# Patient Record
Sex: Female | Born: 1935 | Race: Black or African American | Hispanic: No | Smoking: Never smoker
Health system: Southern US, Community
[De-identification: ages and names within clinical notes are randomized; demographics above are authoritative.]

## PROBLEM LIST (undated history)

## (undated) DIAGNOSIS — F0280 Dementia in other diseases classified elsewhere without behavioral disturbance: Secondary | ICD-10-CM

## (undated) DIAGNOSIS — E079 Disorder of thyroid, unspecified: Secondary | ICD-10-CM

## (undated) DIAGNOSIS — R413 Other amnesia: Secondary | ICD-10-CM

## (undated) DIAGNOSIS — G309 Alzheimer's disease, unspecified: Secondary | ICD-10-CM

## (undated) HISTORY — DX: Alzheimer's disease, unspecified: G30.9

## (undated) HISTORY — PX: MOUTH SURGERY: SHX715

## (undated) HISTORY — PX: THYROID SURGERY: SHX805

## (undated) HISTORY — PX: ABDOMINAL HYSTERECTOMY: SHX81

## (undated) HISTORY — DX: Disorder of thyroid, unspecified: E07.9

## (undated) HISTORY — DX: Dementia in other diseases classified elsewhere, unspecified severity, without behavioral disturbance, psychotic disturbance, mood disturbance, and anxiety: F02.80

---

## 1999-09-29 ENCOUNTER — Ambulatory Visit (HOSPITAL_COMMUNITY): Admission: RE | Admit: 1999-09-29 | Discharge: 1999-09-29 | Payer: Self-pay | Admitting: *Deleted

## 2002-06-17 ENCOUNTER — Encounter: Admission: RE | Admit: 2002-06-17 | Discharge: 2002-06-17 | Payer: Self-pay | Admitting: Family Medicine

## 2002-06-17 ENCOUNTER — Encounter: Payer: Self-pay | Admitting: Family Medicine

## 2002-06-25 ENCOUNTER — Ambulatory Visit (HOSPITAL_COMMUNITY): Admission: RE | Admit: 2002-06-25 | Discharge: 2002-06-25 | Payer: Self-pay | Admitting: Family Medicine

## 2002-06-25 ENCOUNTER — Encounter: Payer: Self-pay | Admitting: Family Medicine

## 2002-08-26 ENCOUNTER — Encounter: Payer: Self-pay | Admitting: Surgery

## 2002-08-27 ENCOUNTER — Ambulatory Visit (HOSPITAL_COMMUNITY): Admission: RE | Admit: 2002-08-27 | Discharge: 2002-08-28 | Payer: Self-pay | Admitting: Surgery

## 2011-10-11 ENCOUNTER — Other Ambulatory Visit (HOSPITAL_COMMUNITY): Payer: Self-pay | Admitting: Neurology

## 2011-10-11 DIAGNOSIS — R413 Other amnesia: Secondary | ICD-10-CM

## 2011-10-19 ENCOUNTER — Inpatient Hospital Stay (HOSPITAL_COMMUNITY): Admission: RE | Admit: 2011-10-19 | Payer: Self-pay | Source: Ambulatory Visit

## 2011-11-01 ENCOUNTER — Encounter (HOSPITAL_COMMUNITY): Payer: Self-pay

## 2011-11-01 ENCOUNTER — Encounter (HOSPITAL_COMMUNITY)
Admission: RE | Admit: 2011-11-01 | Discharge: 2011-11-01 | Disposition: A | Discharge status: Home / Self Care | Payer: Self-pay | Source: Ambulatory Visit | Attending: Neurology | Admitting: Neurology

## 2011-11-01 DIAGNOSIS — R413 Other amnesia: Secondary | ICD-10-CM | POA: Insufficient documentation

## 2011-11-01 DIAGNOSIS — Z006 Encounter for examination for normal comparison and control in clinical research program: Secondary | ICD-10-CM | POA: Insufficient documentation

## 2011-11-01 HISTORY — DX: Other amnesia: R41.3

## 2012-01-25 ENCOUNTER — Ambulatory Visit (INDEPENDENT_AMBULATORY_CARE_PROVIDER_SITE_OTHER): Payer: Medicare PPO | Admitting: Emergency Medicine

## 2012-01-25 VITALS — BP 141/72 | HR 78 | Temp 98.5°F | Resp 16 | Ht 63.5 in | Wt 104.0 lb

## 2012-01-25 DIAGNOSIS — M79646 Pain in unspecified finger(s): Secondary | ICD-10-CM

## 2012-01-25 DIAGNOSIS — S61219A Laceration without foreign body of unspecified finger without damage to nail, initial encounter: Secondary | ICD-10-CM

## 2012-01-25 DIAGNOSIS — S61209A Unspecified open wound of unspecified finger without damage to nail, initial encounter: Secondary | ICD-10-CM

## 2012-01-25 DIAGNOSIS — M79609 Pain in unspecified limb: Secondary | ICD-10-CM

## 2012-01-25 NOTE — Progress Notes (Signed)
   Date:  01/25/2012   Name:  Sheina Mcleish   DOB:  November 19, 1928   MRN:  [id number] Gender: female  Age: 78 y.o.  PCP:  No primary provider on file.    Chief Complaint: Hand Pain   History of Present Illness:  Amarah Brossman is a 78 y.o. pleasant patient who presents with the following:  Injured left second finger with knife while cutting up chicken today.  Denies other complaint.  Not up to date on tetanus.  No numbness or paresthesias.    There is no problem list on file for this patient.   Past Medical History  Diagnosis Date  . Memory loss     No past surgical history on file.  History  Substance Use Topics  . Smoking status: Never Smoker   . Smokeless tobacco: Not on file  . Alcohol Use: Not on file    No family history on file.  No Known Allergies  Medication list has been reviewed and updated.  Current Outpatient Prescriptions on File Prior to Visit  Medication Sig Dispense Refill  . donepezil (ARICEPT) 5 MG tablet Take 5 mg by mouth at bedtime.      . hydrochlorothiazide (HYDRODIURIL) 25 MG tablet Take 25 mg by mouth daily.      Marland Kitchen levothyroxine (SYNTHROID, LEVOTHROID) 75 MCG tablet Take 75 mcg by mouth daily.      . potassium chloride (KLOR-CON) 20 MEQ packet Take 20 mEq by mouth daily.        Review of Systems:  As per HPI, otherwise negative.    Physical Examination: Filed Vitals:   01/25/12 1311  BP: 141/72  Pulse: 78  Temp: 98.5 F (36.9 C)  Resp: 16   Filed Vitals:   01/25/12 1311  Height: 5' 3.5" (1.613 m)  Weight: 104 lb (47.174 kg)   Body mass index is 18.13 kg/(m^2). Ideal Body Weight: Weight in (lb) to have BMI = 25: 143.1    GEN: WDWN, NAD, Non-toxic, Alert & Oriented x 3 HEENT: Atraumatic, Normocephalic.  Ears and Nose: No external deformity. EXTR: No clubbing/cyanosis/edema NEURO: Normal gait.  PSYCH: Normally interactive. Conversant. Not depressed or anxious appearing.  Calm demeanor.  Transverse laceration finger at  PIP.  Obvious extensor tendon injury with inability to extend finger and tendon ends visible in wound.    Assessment and Plan:  Extensor tendon laceration Refer hand surgery  Carmelina Dane, M.D.

## 2012-01-25 NOTE — Progress Notes (Signed)
Patient ID: Artelia Game MRN: [id number], DOB: May 07, 1929, 78 y.o. Date of Encounter: 01/25/2012, 2:15 PM   PROCEDURE NOTE: Verbal consent obtained. Sterile technique employed. Numbing: Anesthesia obtained with 2% lidocaine plain.  Cleansed with soap and water. Irrigated.  Wound explored no foreign bodies.  Extensor tendon laceration.  Wound repaired with # 7 SI sutures using 5-0 ethilon. Hemostasis obtained. Wound cleansed and dressed.  Wound care instructions including precautions covered with patient. Handout given.  Patient to follow up with hand specialist.  Rhoderick Moody, PA-C 01/25/2012 2:15 PM

## 2012-03-05 ENCOUNTER — Other Ambulatory Visit: Payer: Self-pay | Admitting: Family Medicine

## 2012-03-05 ENCOUNTER — Ambulatory Visit
Admission: RE | Admit: 2012-03-05 | Discharge: 2012-03-05 | Disposition: A | Discharge status: Home / Self Care | Payer: No Typology Code available for payment source | Source: Ambulatory Visit | Attending: Family Medicine | Admitting: Family Medicine

## 2012-03-05 DIAGNOSIS — T1490XA Injury, unspecified, initial encounter: Secondary | ICD-10-CM

## 2012-10-16 ENCOUNTER — Ambulatory Visit (INDEPENDENT_AMBULATORY_CARE_PROVIDER_SITE_OTHER): Payer: Medicaid Other | Admitting: *Deleted

## 2012-10-16 DIAGNOSIS — I639 Cerebral infarction, unspecified: Secondary | ICD-10-CM

## 2012-10-16 DIAGNOSIS — Z0289 Encounter for other administrative examinations: Secondary | ICD-10-CM

## 2012-10-17 NOTE — Progress Notes (Signed)
This participant in the office today for her Avid Exit Visit. Patient scales and Adas-Cog was completed by Lorenza Burton and [facility].  Dr. Pearlean Brownie did the Neurological Exam.

## 2013-04-29 ENCOUNTER — Other Ambulatory Visit: Payer: Self-pay | Admitting: Family Medicine

## 2013-04-29 DIAGNOSIS — D179 Benign lipomatous neoplasm, unspecified: Secondary | ICD-10-CM

## 2013-05-04 ENCOUNTER — Ambulatory Visit
Admission: RE | Admit: 2013-05-04 | Discharge: 2013-05-04 | Disposition: A | Discharge status: Home / Self Care | Payer: No Typology Code available for payment source | Source: Ambulatory Visit | Attending: Family Medicine | Admitting: Family Medicine

## 2013-05-04 DIAGNOSIS — D179 Benign lipomatous neoplasm, unspecified: Secondary | ICD-10-CM

## 2013-05-12 ENCOUNTER — Encounter: Payer: Self-pay | Admitting: Family Medicine

## 2013-05-12 DIAGNOSIS — G259 Extrapyramidal and movement disorder, unspecified: Secondary | ICD-10-CM | POA: Insufficient documentation

## 2013-05-12 DIAGNOSIS — F0391 Unspecified dementia with behavioral disturbance: Secondary | ICD-10-CM | POA: Insufficient documentation

## 2013-05-12 DIAGNOSIS — I10 Essential (primary) hypertension: Secondary | ICD-10-CM | POA: Insufficient documentation

## 2013-05-12 DIAGNOSIS — E039 Hypothyroidism, unspecified: Secondary | ICD-10-CM | POA: Insufficient documentation

## 2013-06-03 ENCOUNTER — Ambulatory Visit (INDEPENDENT_AMBULATORY_CARE_PROVIDER_SITE_OTHER): Payer: Medicare (Managed Care) | Admitting: Podiatry

## 2013-06-03 ENCOUNTER — Encounter: Payer: Self-pay | Admitting: Podiatry

## 2013-06-03 VITALS — BP 118/68 | HR 79 | Resp 16

## 2013-06-03 DIAGNOSIS — L84 Corns and callosities: Secondary | ICD-10-CM

## 2013-06-03 DIAGNOSIS — B351 Tinea unguium: Secondary | ICD-10-CM

## 2013-06-03 DIAGNOSIS — M79609 Pain in unspecified limb: Secondary | ICD-10-CM

## 2013-06-03 NOTE — Progress Notes (Signed)
Patient ID: Jenny Reyes, female   DOB: 1929-03-22, 79 y.o.   MRN: [id number]  Subjective: 79 year old black female presents with a relative complaining of painful toenails and keratoses. The last visit for this service was 11/03/2012.  Objective: Elongated, hypertrophic, discolored toenails x10. Nucleated keratoses lateral fifth left toe  Assessment: Symptomatic onychomycoses x10 Keratoses x1  Plan: Debridement of toenails and keratoses x1 without any bleeding. Reappoint intervals recommended a 3 months.

## 2013-12-15 ENCOUNTER — Other Ambulatory Visit: Payer: Self-pay | Admitting: *Deleted

## 2013-12-15 ENCOUNTER — Ambulatory Visit
Admission: RE | Admit: 2013-12-15 | Discharge: 2013-12-15 | Disposition: A | Discharge status: Home / Self Care | Payer: No Typology Code available for payment source | Source: Ambulatory Visit | Attending: *Deleted | Admitting: *Deleted

## 2013-12-15 DIAGNOSIS — R609 Edema, unspecified: Secondary | ICD-10-CM

## 2013-12-17 ENCOUNTER — Encounter (HOSPITAL_COMMUNITY): Payer: Self-pay | Admitting: Emergency Medicine

## 2013-12-17 ENCOUNTER — Emergency Department (HOSPITAL_COMMUNITY): Payer: Medicare (Managed Care)

## 2013-12-17 ENCOUNTER — Inpatient Hospital Stay (HOSPITAL_COMMUNITY)
Admission: EM | Admit: 2013-12-17 | Discharge: 2013-12-21 | DRG: 293 | Disposition: A | Discharge status: Skilled Nursing Facility | Payer: Medicare (Managed Care) | Attending: Oncology | Admitting: Oncology

## 2013-12-17 DIAGNOSIS — I509 Heart failure, unspecified: Secondary | ICD-10-CM | POA: Diagnosis present

## 2013-12-17 DIAGNOSIS — I43 Cardiomyopathy in diseases classified elsewhere: Secondary | ICD-10-CM | POA: Diagnosis present

## 2013-12-17 DIAGNOSIS — R079 Chest pain, unspecified: Secondary | ICD-10-CM | POA: Diagnosis present

## 2013-12-17 DIAGNOSIS — I059 Rheumatic mitral valve disease, unspecified: Secondary | ICD-10-CM | POA: Diagnosis present

## 2013-12-17 DIAGNOSIS — E039 Hypothyroidism, unspecified: Secondary | ICD-10-CM | POA: Diagnosis present

## 2013-12-17 DIAGNOSIS — E876 Hypokalemia: Secondary | ICD-10-CM | POA: Diagnosis present

## 2013-12-17 DIAGNOSIS — F0280 Dementia in other diseases classified elsewhere without behavioral disturbance: Secondary | ICD-10-CM | POA: Diagnosis present

## 2013-12-17 DIAGNOSIS — G309 Alzheimer's disease, unspecified: Secondary | ICD-10-CM | POA: Diagnosis present

## 2013-12-17 DIAGNOSIS — F0391 Unspecified dementia with behavioral disturbance: Secondary | ICD-10-CM | POA: Diagnosis present

## 2013-12-17 DIAGNOSIS — IMO0002 Reserved for concepts with insufficient information to code with codable children: Secondary | ICD-10-CM

## 2013-12-17 DIAGNOSIS — F03918 Unspecified dementia, unspecified severity, with other behavioral disturbance: Secondary | ICD-10-CM | POA: Diagnosis present

## 2013-12-17 LAB — BASIC METABOLIC PANEL
Anion gap: 13 (ref 5–15)
BUN: 23 mg/dL (ref 6–23)
CHLORIDE: 94 meq/L — AB (ref 96–112)
CO2: 30 mEq/L (ref 19–32)
Calcium: 8.9 mg/dL (ref 8.4–10.5)
Creatinine, Ser: 1.32 mg/dL — ABNORMAL HIGH (ref 0.50–1.10)
GFR calc Af Amer: 42 mL/min — ABNORMAL LOW (ref >90)
GFR, EST NON AFRICAN AMERICAN: 36 mL/min — AB (ref >90)
Glucose, Bld: 65 mg/dL — ABNORMAL LOW (ref 70–99)
Potassium: 3.5 mEq/L — ABNORMAL LOW (ref 3.7–5.3)
SODIUM: 137 meq/L (ref 137–147)

## 2013-12-17 LAB — CBC
HCT: 33.7 % — ABNORMAL LOW (ref 36.0–46.0)
HEMOGLOBIN: 11.2 g/dL — AB (ref 12.0–15.0)
MCH: 28.6 pg (ref 26.0–34.0)
MCHC: 33.2 g/dL (ref 30.0–36.0)
MCV: 86 fL (ref 78.0–100.0)
Platelets: 219 10*3/uL (ref 150–400)
RBC: 3.92 MIL/uL (ref 3.87–5.11)
RDW: 14.3 % (ref 11.5–15.5)
WBC: 3.2 10*3/uL — AB (ref 4.0–10.5)

## 2013-12-17 LAB — I-STAT TROPONIN, ED: TROPONIN I, POC: 0.02 ng/mL (ref 0.00–0.08)

## 2013-12-17 LAB — PRO B NATRIURETIC PEPTIDE: PRO B NATRI PEPTIDE: 17994 pg/mL — AB (ref 0–450)

## 2013-12-17 MED ORDER — ASPIRIN 325 MG PO TABS
325.0000 mg | ORAL_TABLET | ORAL | Status: AC
  Administered 2013-12-17: 325 mg via ORAL
  Filled 2013-12-17: qty 1

## 2013-12-17 NOTE — ED Notes (Signed)
80 yo female from [facility] unit with Chest pain in the center of her chest. Cough for several days per staff and pain worsens with cough. Denies radiation and is unable to rate pain. Per EMS EKG no elevation or depression. Vitals are stable. Pt alert and confused. Received 324 ASA with EMS.   140/72 90 98% RA

## 2013-12-17 NOTE — ED Provider Notes (Signed)
CSN: [id number]     Arrival date & time 12/17/13  1825 History   First MD Initiated Contact with Patient 12/17/13 1838     Chief Complaint  Patient presents with  . Chest Pain     (Consider location/radiation/quality/duration/timing/severity/associated sxs/prior Treatment) HPI  History from SNF: Began complaining of severe chest pain, grabbing her chest wall. Not typical for the patient. Asked patient if she was having chest pain. Appeared to have trouble breathing. Mild cough. Cough, increasing edema in bilateral legs and feets. Started on Z-pak recently for rule out infection. Lasix one time order given earlier this week. Now on 80 mg daily. Labs now show concern for heart failure. Family did not want to start palliative care. No history of heart failure. Concern for hypothyroidism, concerning to lead to CHF. Slightly diaphoretic.   Past Medical History  Diagnosis Date  . Memory loss   . Thyroid disease   . Alzheimer disease    Past Surgical History  Procedure Laterality Date  . Thyroid surgery    . Abdominal hysterectomy    . Mouth surgery     History reviewed. No pertinent family history. History  Substance Use Topics  . Smoking status: Never Smoker   . Smokeless tobacco: Not on file  . Alcohol Use: No   OB History   Grav Para Term Preterm Abortions TAB SAB Ect Mult Living                 Review of Systems  Unable to perform ROS: Dementia      Allergies  Review of patient's allergies indicates no active allergies.  Home Medications   Prior to Admission medications   Medication Sig Start Date End Date Taking? Authorizing Provider  Acetaminophen (ACETAMINOPHEN EXTRA STRENGTH) 167 MG/5ML LIQD Take 15 mLs by mouth every 6 (six) hours as needed (for pain).   Yes Historical Provider, MD  furosemide (LASIX) 80 MG tablet Take 80 mg by mouth daily.   Yes Historical Provider, MD  levothyroxine (SYNTHROID, LEVOTHROID) 88 MCG tablet Take 88 mcg by mouth daily before  breakfast.   Yes Historical Provider, MD  risperiDONE (RISPERDAL) 1 MG/ML oral solution Take 0.25 mg by mouth 2 (two) times daily.   Yes Historical Provider, MD  traZODone (DESYREL) 150 MG tablet Take 150 mg by mouth at bedtime.   Yes Historical Provider, MD   BP 142/79  Pulse 83  Temp(Src) 98.1 F (36.7 C) (Oral)  Resp 20  Ht 5\' 2"  (1.575 m)  Wt 140 lb 11.2 oz (63.821 kg)  BMI 25.73 kg/m2  SpO2 97% Physical Exam  Constitutional: She appears well-developed and well-nourished. No distress.  HENT:  Head: Normocephalic and atraumatic.  Eyes: Pupils are equal, round, and reactive to light. Right eye exhibits no discharge. Left eye exhibits no discharge.  Neck: Normal range of motion.  Cardiovascular: Normal rate, regular rhythm and normal heart sounds.   Pulmonary/Chest: Effort normal and breath sounds normal.  Abdominal: Soft. She exhibits no distension. There is no tenderness.  Musculoskeletal: Normal range of motion.       Right lower leg: She exhibits swelling and edema.       Left lower leg: She exhibits swelling and edema.  Neurological: She is alert. GCS eye subscore is 4. GCS verbal subscore is 4. GCS motor subscore is 6.  Skin: Skin is warm. She is not diaphoretic.    ED Course  Procedures (including critical care time) Labs Review Labs Reviewed  CBC - Abnormal;  Notable for the following:    WBC 3.2 (*)    Hemoglobin 11.2 (*)    HCT 33.7 (*)    All other components within normal limits  BASIC METABOLIC PANEL - Abnormal; Notable for the following:    Potassium 3.5 (*)    Chloride 94 (*)    Glucose, Bld 65 (*)    Creatinine, Ser 1.32 (*)    GFR calc non Af Amer 36 (*)    GFR calc Af Amer 42 (*)    All other components within normal limits  PRO B NATRIURETIC PEPTIDE - Abnormal; Notable for the following:    Pro B Natriuretic peptide (BNP) 17994.0 (*)    All other components within normal limits  TROPONIN I  TROPONIN I  TROPONIN I  BASIC METABOLIC PANEL  CBC   I-STAT TROPOININ, ED    Imaging Review Dg Chest 2 View  12/17/2013   CLINICAL DATA:  Chest pain  EXAM: CHEST  2 VIEW  COMPARISON:  12/15/2013  FINDINGS: Grossly unchanged marked enlargement of the cardiac silhouette and mediastinal contours with atherosclerotic plaque within the thoracic aorta. The pulmonary vasculature appears less distinct than present examination with cephalization of flow. Grossly unchanged small bilateral pleural effusions with associated bibasilar opacities, left greater than right. No new focal airspace opacities. No pneumothorax. Unchanged bones.  IMPRESSION: Grossly unchanged findings of cardiomegaly, pulmonary edema, small bilateral effusions and associated bibasilar opacities, left greater than right, atelectasis versus infiltrate.   Electronically Signed   By: Sandi Mariscal M.D.   On: 12/17/2013 20:36     EKG Interpretation None      MDM   Final diagnoses:  Chest pain at rest    80 year old female with a history of hypothyroidism, Alzheimer's who presents today from a skilled nursing facility for concerns for chest pain.   According to the skilled nursing facility, the patient has been having some leg swelling over the course of the last week. Her primary care physician recommended Lasix. She has been able to decrease her weight with the Lasix, however today she had an episode of chest pain and was transferred over for this.  Upon arrival the patient is not complaining of any chest pain. She is hemodynamically stable and appears in no acute distress. Her  Exam is significant for lower extremity swelling. Given the patient's recent history of new onset CHF given the new episode of chest pain, plan is to admit the patient to the hospital for further evaluation and management. Patient will be admitted to the internal medicine teaching service. Patient admitted to the floor in stable condition. Patient was seen and evaluated by myself and by the attending Dr. Jeanell Sparrow.      Freddi Che, MD 12/18/13 [phone]

## 2013-12-17 NOTE — H&P (Signed)
Date: 12/18/2013               Patient Name:  Jenny Reyes MRN: [id number]  DOB: 1928-08-18 Age / Sex: 80 y.o., female   PCP: Janifer Adie, MD         Medical Service: Internal Medicine Teaching Service         Attending Physician: Dr. Beryle Beams    First Contact: Dr. Arcelia Jew Pager: [phone]  Second Contact: Dr. Algis Liming Pager: [phone]       After Hours (After 5p/  First Contact Pager: [phone]  weekends / holidays): Second Contact Pager: [phone]   Chief Complaint: Chest Pain  History of Present Illness: Jenny Reyes is a 80 y.o. Female with PMHx of HTN, hypothyroidism, CHF, and Alzheimer's disease who presents to the ED from [facility] with complaint of chest pain. Pt has Alzheimer's disease and is a poor historian. HPI is taken mostly from daughters, ED note and some from the patient. At [facility] assisted living facility, nurses noticed the pt grabbing her chest and complaining of chest pain which is atypical for her. She also appeared to have trouble breathing. Nurses called EMS to bring her into the ED. Per daughters, pt has had increased cough and leg swelling for the past week. She was given a Z-pack recently out of concern for infection. Pt does not have an official diagnosis of CHF. Pt denies chest pain, shortness of breath, nausea, vomiting, diarrhea, fever or chill. She states she feels fine.   Meds: Current Facility-Administered Medications  Medication Dose Route Frequency Provider Last Rate Last Dose  . acetaminophen (TYLENOL) solution 500 mg  500 mg Oral Q6H PRN Annia Belt, MD      . enoxaparin (LOVENOX) injection 40 mg  40 mg Subcutaneous Q24H Corky Sox, MD      . furosemide (LASIX) injection 40 mg  40 mg Intravenous Once Corky Sox, MD      . furosemide (LASIX) tablet 80 mg  80 mg Oral Daily Corky Sox, MD      . levothyroxine (SYNTHROID, LEVOTHROID) tablet 88 mcg  88 mcg Oral QAC breakfast Corky Sox, MD      . potassium chloride SA (K-DUR,KLOR-CON)  CR tablet 40 mEq  40 mEq Oral Once Corky Sox, MD      . risperiDONE (RISPERDAL) 1 MG/ML oral solution 0.25 mg  0.25 mg Oral BID Corky Sox, MD      . traZODone (DESYREL) tablet 150 mg  150 mg Oral QHS Corky Sox, MD        Allergies: Allergies as of 12/17/2013  . (No Active Allergies)   Past Medical History  Diagnosis Date  . Memory loss   . Thyroid disease   . Alzheimer disease    Past Surgical History  Procedure Laterality Date  . Thyroid surgery    . Abdominal hysterectomy    . Mouth surgery     History reviewed. No pertinent family history. History   Social History  . Marital Status: Widowed    Spouse Name: N/A    Number of Children: N/A  . Years of Education: N/A   Occupational History  . Not on file.   Social History Main Topics  . Smoking status: Never Smoker   . Smokeless tobacco: Not on file  . Alcohol Use: No  . Drug Use: Not on file  . Sexual Activity: Not on file   Other Topics Concern  . Not on file  Social History Narrative  . No narrative on file    Review of Systems: Pt is poor historian 2/2 Alzheimer's disease General: Denies fever, chills, diaphoresis, appetite change and fatigue.  Respiratory: Denies SOB, DOE, cough, chest tightness, and wheezing.   Cardiovascular: Denies chest pain and palpitations.  Gastrointestinal: Denies nausea, vomiting, abdominal pain, diarrhea, constipation, blood in stool and abdominal distention.  Genitourinary: Denies dysuria, urgency, frequency, hematuria, and flank pain. Endocrine: Denies hot or cold intolerance, polyuria, and polydipsia. Musculoskeletal: Denies myalgias, back pain, joint swelling, arthralgias and gait problem.  Skin: Denies pallor, rash and wounds.  Neurological: Denies dizziness, weakness, lightheadedness, numbness and headaches.  Psychiatric/Behavioral: Denies mood changes, confusion, nervousness, sleep disturbance and agitation.  Physical Exam: Filed Vitals:   12/17/13 2245  12/17/13 2300 12/17/13 2315 12/17/13 2351  BP: 142/79 155/63 127/65 142/79  Pulse: 74 73 75 83  Temp:    98.1 F (36.7 C)  TempSrc:    Oral  Resp: 18 21 24 20   Height:    5\' 2"  (1.575 m)  Weight:    140 lb 11.2 oz (63.821 kg)  SpO2: 98% 98% 98% 97%   General: Vital signs reviewed.  Patient is a well-developed and well-nourished, in no acute distress and cooperative with exam.  Head: Normocephalic and atraumatic. Eyes:  EOMI, conjunctivae normal, No scleral icterus.  Neck: Supple, trachea midline, normal ROM, No JVD, masses, thyromegaly, or carotid bruit present.  Cardiovascular: RRR, 2/6 systolic ejection murmur.  Pulmonary/Chest: Bibasilar crackles. No wheezes or rhonchi. Abdominal: Soft, non-tender, non-distended, BS +, no masses, organomegaly, or guarding present.  Musculoskeletal: No joint deformities, erythema, or stiffness, ROM full and nontender. Extremities: 3-4+ pitting edema in lower extremities bilaterally extending past knees,  pulses symmetric and intact bilaterally. No cyanosis or clubbing. Neurological: Awake and alert, Strength is normal and symmetric bilaterally, cranial nerve II-XII are grossly intact, no focal motor deficit, sensory intact to light touch bilaterally.  Skin: Warm, dry and intact. No rashes or erythema. Psychiatric: Normal mood and affect. speech and behavior is normal. Decreased cognition and memory.    Lab results: Basic Metabolic Panel:  Recent Labs  12/17/13 1951  NA 137  K 3.5*  CL 94*  CO2 30  GLUCOSE 65*  BUN 23  CREATININE 1.32*  CALCIUM 8.9   CBC:  Recent Labs  12/17/13 1951  WBC 3.2*  HGB 11.2*  HCT 33.7*  MCV 86.0  PLT 219   BNP:  Recent Labs  12/17/13 1951  PROBNP 17994.0*    Imaging results:  Dg Chest 2 View  12/17/2013   CLINICAL DATA:  Chest pain  EXAM: CHEST  2 VIEW  COMPARISON:  12/15/2013  FINDINGS: Grossly unchanged marked enlargement of the cardiac silhouette and mediastinal contours with  atherosclerotic plaque within the thoracic aorta. The pulmonary vasculature appears less distinct than present examination with cephalization of flow. Grossly unchanged small bilateral pleural effusions with associated bibasilar opacities, left greater than right. No new focal airspace opacities. No pneumothorax. Unchanged bones.  IMPRESSION: Grossly unchanged findings of cardiomegaly, pulmonary edema, small bilateral effusions and associated bibasilar opacities, left greater than right, atelectasis versus infiltrate.   Electronically Signed   By: Sandi Mariscal M.D.   On: 12/17/2013 20:36    Other results: EKG: normal EKG, normal sinus rhythm, unchanged from previous tracings  Assessment & Plan by Problem:  Acute on Chronic CHF exacerbation: Pt has 3-4+ pitting edema in her lower extremities bilaterally extending up to her knees worsening over the past  week. She denies shortness of breath, but has a cough. She is without an official diagnosis of CHF, no previous echocardiograms. Pro-BNP 17,994 without prior comparison. CXR shows cardiomegaly, pulmonary edema, small bilateral effusions and associated bibasilar opacities, left greater than right, atelectasis versus infiltrate. Pt was placed on a Z-pack last week out of concern for bacterial URI. Pt is afebrile and satting 100% on room air. According to her sisters she has about a 25 pound weight gain in the 1 or 2 months. Pt also reportedly has been off her synthroid and her TSH was greatly elevated. Pt was restarted on synthroid yesterday. CHF exacerbation could be 2/2 infection versus previously untreated hypothyroidism. WBC 3.2 and pt afebrile. Pt is on lasix 80 mg daily at home.  -Lasix 40 mg IV once -Lasix 80 mg po daily -ASA 325 mg once -2D Echocardiogram in am -NPO -Trend troponins -I/Os and Daily Weights -Repeat CBC/BMP in am -EKG in am  Hypokalemia: Potassium 3.5 on admission. Pt denies current chest palpitations, palpitations of shortness of  breath.  -Check Mg -KCl 40 mEq once -Repeat BMP in am  Hypothyroidism: Pt has history of hypothyroidism, but her synthroid had been stopped previously. TSH was recently rechecked and was very elevated. Pt was restarted on synthroid yesterday. Untreated hypothyroidism likely contributed to CHF exacerbation. -Synthroid 88 mcg daily  Alzheimer's Disease: Pt very calm and pleasant. Poor historian. Stable per family. Daughters would like pt to return to [place] after hospital stay. -Continue Risperidal 0.25 mg BID -Continue Trazodone 150 mg daily -Bed rest -PT/OT -Social Work Consult  DVT/PE ppx: Lovenox 40 mg SQ daily  Dispo: Disposition is deferred at this time, awaiting improvement of current medical problems. Anticipated discharge in approximately 1-2 day(s).   The patient does have a current PCP Janifer Adie, MD) and does not need an [facility] hospital follow-up appointment after discharge.  The patient does not have transportation limitations that hinder transportation to clinic appointments.  Signed: Osa Craver, DO PGY-1 Internal Medicine Resident Pager # [phone] 12/18/2013 1:04 AM

## 2013-12-17 NOTE — ED Notes (Signed)
Denies chest pain 

## 2013-12-17 NOTE — Progress Notes (Signed)
Patient transferred from the ED via stretcher to oom 3E13. Pt oriented to call bell system and other room equipment and educated to heart failure floor but patient has dementia and is not able to fully understand. Family is at the bedside and understands information explained. VSS. Telemetry box applied and CMT called and notified that telemetry box has been applied to patient. Currently patient complains of no pain or discomfort. Patient used the bedside commode with assistance. Will continue to monitor patient to end of shift.

## 2013-12-18 ENCOUNTER — Encounter (HOSPITAL_COMMUNITY): Payer: Self-pay | Admitting: Surgery

## 2013-12-18 DIAGNOSIS — I059 Rheumatic mitral valve disease, unspecified: Secondary | ICD-10-CM

## 2013-12-18 DIAGNOSIS — R079 Chest pain, unspecified: Secondary | ICD-10-CM

## 2013-12-18 LAB — BASIC METABOLIC PANEL
Anion gap: 12 (ref 5–15)
Anion gap: 11 (ref 5–15)
BUN: 21 mg/dL (ref 6–23)
BUN: 20 mg/dL (ref 6–23)
CALCIUM: 8.8 mg/dL (ref 8.4–10.5)
CO2: 30 mEq/L (ref 19–32)
CO2: 32 meq/L (ref 19–32)
Calcium: 8.5 mg/dL (ref 8.4–10.5)
Chloride: 100 mEq/L (ref 96–112)
Chloride: 98 mEq/L (ref 96–112)
Creatinine, Ser: 1.22 mg/dL — ABNORMAL HIGH (ref 0.50–1.10)
Creatinine, Ser: 1.25 mg/dL — ABNORMAL HIGH (ref 0.50–1.10)
GFR calc Af Amer: 46 mL/min — ABNORMAL LOW (ref >90)
GFR calc Af Amer: 45 mL/min — ABNORMAL LOW (ref >90)
GFR calc non Af Amer: 40 mL/min — ABNORMAL LOW (ref >90)
GFR calc non Af Amer: 39 mL/min — ABNORMAL LOW (ref >90)
GLUCOSE: 84 mg/dL (ref 70–99)
Glucose, Bld: 90 mg/dL (ref 70–99)
POTASSIUM: 3.5 meq/L — AB (ref 3.7–5.3)
Potassium: 3.2 mEq/L — ABNORMAL LOW (ref 3.7–5.3)
SODIUM: 142 meq/L (ref 137–147)
SODIUM: 141 meq/L (ref 137–147)

## 2013-12-18 LAB — MAGNESIUM: Magnesium: 1.6 mg/dL (ref 1.5–2.5)

## 2013-12-18 LAB — CBC
HCT: 32 % — ABNORMAL LOW (ref 36.0–46.0)
Hemoglobin: 10.5 g/dL — ABNORMAL LOW (ref 12.0–15.0)
MCH: 28.3 pg (ref 26.0–34.0)
MCHC: 32.8 g/dL (ref 30.0–36.0)
MCV: 86.3 fL (ref 78.0–100.0)
Platelets: 209 10*3/uL (ref 150–400)
RBC: 3.71 MIL/uL — ABNORMAL LOW (ref 3.87–5.11)
RDW: 14.2 % (ref 11.5–15.5)
WBC: 3 10*3/uL — AB (ref 4.0–10.5)

## 2013-12-18 LAB — TROPONIN I
Troponin I: <0.3 ng/mL (ref <0.30)
Troponin I: <0.3 ng/mL (ref <0.30)
Troponin I: <0.3 ng/mL (ref <0.30)

## 2013-12-18 LAB — MRSA PCR SCREENING: MRSA by PCR: NEGATIVE

## 2013-12-18 MED ORDER — FUROSEMIDE 10 MG/ML IJ SOLN
40.0000 mg | Freq: Once | INTRAMUSCULAR | Status: AC
Start: 2013-12-18 — End: 2013-12-18
  Administered 2013-12-18: 40 mg via INTRAVENOUS
  Filled 2013-12-18: qty 4

## 2013-12-18 MED ORDER — RISPERIDONE 1 MG/ML PO SOLN
0.2500 mg | Freq: Two times a day (BID) | ORAL | Status: DC
Start: 2013-12-18 — End: 2013-12-21
  Administered 2013-12-18 – 2013-12-21 (×8): 0.25 mg via ORAL
  Filled 2013-12-18 (×11): qty 0.25

## 2013-12-18 MED ORDER — FUROSEMIDE 10 MG/ML IJ SOLN
40.0000 mg | Freq: Once | INTRAMUSCULAR | Status: AC
  Administered 2013-12-18: 40 mg via INTRAVENOUS
  Filled 2013-12-18: qty 4

## 2013-12-18 MED ORDER — FUROSEMIDE 10 MG/ML IJ SOLN
40.0000 mg | Freq: Once | INTRAMUSCULAR | Status: DC
  Filled 2013-12-18: qty 4

## 2013-12-18 MED ORDER — FUROSEMIDE 80 MG PO TABS
80.0000 mg | ORAL_TABLET | Freq: Every day | ORAL | Status: DC
  Filled 2013-12-18: qty 1

## 2013-12-18 MED ORDER — ACETAMINOPHEN 167 MG/5ML PO LIQD
15.0000 mL | Freq: Four times a day (QID) | ORAL | Status: DC | PRN
Start: 2013-12-18 — End: 2013-12-18

## 2013-12-18 MED ORDER — POTASSIUM CHLORIDE CRYS ER 20 MEQ PO TBCR
40.0000 meq | EXTENDED_RELEASE_TABLET | Freq: Once | ORAL | Status: AC
  Administered 2013-12-18: 40 meq via ORAL
  Filled 2013-12-18: qty 2

## 2013-12-18 MED ORDER — LEVOTHYROXINE SODIUM 88 MCG PO TABS
88.0000 ug | ORAL_TABLET | Freq: Every day | ORAL | Status: DC
  Administered 2013-12-18 – 2013-12-21 (×4): 88 ug via ORAL
  Filled 2013-12-18 (×6): qty 1

## 2013-12-18 MED ORDER — ACETAMINOPHEN 160 MG/5ML PO SOLN
500.0000 mg | Freq: Four times a day (QID) | ORAL | Status: DC | PRN
  Filled 2013-12-18 (×2): qty 20

## 2013-12-18 MED ORDER — TRAZODONE HCL 150 MG PO TABS
150.0000 mg | ORAL_TABLET | Freq: Every day | ORAL | Status: DC
  Administered 2013-12-18 – 2013-12-20 (×4): 150 mg via ORAL
  Filled 2013-12-18 (×6): qty 1

## 2013-12-18 MED ORDER — ENOXAPARIN SODIUM 40 MG/0.4ML ~~LOC~~ SOLN
40.0000 mg | SUBCUTANEOUS | Status: DC
  Administered 2013-12-18 – 2013-12-19 (×2): 40 mg via SUBCUTANEOUS
  Filled 2013-12-18 (×2): qty 0.4

## 2013-12-18 NOTE — ED Provider Notes (Signed)
Level 5 caveat secondary to dementia  Patient thought by np at [facility] to have increasing chf and then noted to be clutching chest. Patient sent to ed for further evaluation.    EKG Interpretation  Date/Time:  Thursday December 17 2013 18:42:08 EDT Ventricular Rate:  82 PR Interval:  191 QRS Duration: 106 QT Interval:  400 QTC Calculation: 467 R Axis:   -30 Text Interpretation:  Normal sinus rhythm nonspecific lateral t wave change No old tracing to compare Confirmed by Patrizia Paule MD, Andee Poles [phone]) on 12/18/2013 4:26:45 PM       I performed a history and physical examination of Jenny Reyes and discussed her management with Dr. Silvio Clayman.  I agree with the history, physical, assessment, and plan of care, with the following exceptions: None  I was present for the following procedures: None Time Spent in Critical Care of the patient: None Time spent in discussions with the patient and family: 7  Jenny Reyes    Shaune Pollack, MD 12/18/13 1627

## 2013-12-18 NOTE — Progress Notes (Signed)
UR completed Aedyn Kempfer K. Jacqualynn Parco, RN, BSN, [place], CCM  12/18/2013 10:55 AM

## 2013-12-18 NOTE — Care Management Note (Addendum)
    Page 1 of 1   12/21/2013     11:25:08 AM CARE MANAGEMENT NOTE 12/21/2013  Patient:  Jenny Reyes, Jenny Reyes   Account Number:  [id number]  Date Initiated:  12/18/2013  Documentation initiated by:  Mariann Laster  Subjective/Objective Assessment:   CHF Exacerbation     Action/Plan:   CM to follow for dispositon needs   Anticipated DC Date:  12/20/2013   Anticipated DC Plan:  SKILLED NURSING FACILITY  In-house referral  Clinical Social Worker      DC Planning Services  CM consult      Choice offered to / List presented to:             Status of service:  Completed, signed off Medicare Important Message given?  YES (If response is "NO", the following Medicare IM given date fields will be blank) Date Medicare IM given:  12/21/2013 Medicare IM given by:  [facility] Date Additional Medicare IM given:   Additional Medicare IM given by:    Discharge Disposition:  [place]  Per UR Regulation:  Reviewed for med. necessity/level of care/duration of stay  If discussed at Long Length of Stay Meetings, dates discussed:    Comments:  Crystal Hutchinson RN, BSN, MSHL, CCM  Nurse - Case Manager,  (Unit [place])  [phone]  12/18/2013 Hx/o CHF exacerbation, hypokalemia 3.5, increased THS from stopping taking  Synthroid Hx/o Alzheimers Social:  From [facility].  Supportive DTR PT RECS:  SNF (SW referral made) OT RECS:  pending

## 2013-12-18 NOTE — Progress Notes (Signed)
Diet consistency downgraded to pureed per pt's daughter's request. She stated her mother eats a pureed diet.

## 2013-12-18 NOTE — Progress Notes (Signed)
For informational purposes, last occurrence of patient being incontinent is at this current moment. Patient voided a very large amount in the bed in which bed was changed once more and patient was given a bath as well. Patient back to bed. EKG done. Will continue to monitor patient to end of shift.

## 2013-12-18 NOTE — Progress Notes (Signed)
Patient was given lasix about an hour or so after arriving on the floor. Patient is very incontinent. Linen changed on two occassions including fitted sheet, top sheet, and gown to describe the amount of urine released. Patient states no discomfort or hurting when asked. Will continue to monitor patient to end of shift.

## 2013-12-18 NOTE — H&P (Signed)
Attending admission note: I personally interviewed and examined this patient and reviewed her pertinent laboratory and ancillary data. History, physical findings, problem evaluation and management plan, accurate as recorded by resident physician Dr. Osa Craver. Clinical summary: 80 year old woman with hypertension, hypothyroidism, and , senile dementia, admitted from [facility] facility when she was noted to be having difficulty breathing , clutching her chest and complaining of chest pain. She's had a approximate 30 pound weight gain with increasing peripheral edema. No prior known myocardial infarction. Minimal medical record documentation in our system. No prior cardiograms or other cardiology studies such as echocardiograms for baseline. Current cardiogram reviewed and shows normal sinus rhythm with no acute ischemic change. T waves are inverted in leads 1 and L. QT interval borderline prolonged. Chest radiograph shows marked cardiomegaly and mild pulmonary vascular congestion consistent with mild congestive heart failure. Serial troponin levels undetectable x3. Pro BNP markedly elevated at 18,000 units Current exam: Elderly African American woman in no distress Blood pressure 142/89, pulse 88, temperature 97.8 F (36.6 C), temperature source Oral, resp. rate 20, height 5\' 2"  (1.575 m), weight 136 lb 0.4 oz (61.7 kg), SpO2 100.00%. Lungs are clear to auscultation and resonant to percussion Regular cardiac rhythm. No murmur gallop or rub. 3+ pitting edema to the knees. Impression: Acute on chronic Congestive heart failure. Current exacerbation may have been related to recent discontinuation of her thyroid medication and an intercurrent bronchitis. I suspect that she has an underlying hypertensive cardiomyopathy. No gross evidence for prior or acute myocardial damage on current EKG. Plan: As outlined in medical resident's note. Parenteral diuresis until stable. Resume thyroid  medications.

## 2013-12-18 NOTE — Evaluation (Signed)
Physical Therapy Evaluation Patient Details Name: Jenny Reyes MRN: [id number] DOB: Apr 07, 1929 Today's Date: 12/18/2013   History of Present Illness  Pt is an 80 y/o female admitted from an Alzheimer's Unit at a SNF with chest pains and SOB.   Clinical Impression  Pt admitted with the above. Pt currently with functional limitations due to the deficits listed below (see PT Problem List). At the time of PT eval, pt required increased encouragement to participate due to Alzheimer's dementia, however functionally did quite well. Will put on for 2-day trial of acute PT to further assess for needs. RN notified that pt pocketing food from breakfast, and daughter states she is usually on a pureed diet because of this. Pt will benefit from skilled PT to increase their independence and safety with mobility to allow discharge to the venue listed below.       Follow Up Recommendations SNF;Supervision/Assistance - 24 hour    Equipment Recommendations  None recommended by PT    Recommendations for Other Services       Precautions / Restrictions Precautions Precautions: Fall Restrictions Weight Bearing Restrictions: No      Mobility  Bed Mobility Overal bed mobility: Needs Assistance Bed Mobility: Supine to Sit     Supine to sit: Min assist     General bed mobility comments: Pt required assist to initiate movement of LE's. Due to dementia, increased time required. Daughter states she usually has no problem sitting up, however since she has been in the hospital she is less willing to move.   Transfers Overall transfer level: Needs assistance Equipment used: None Transfers: Sit to/from Stand Sit to Stand: Min guard         General transfer comment: Pt able to power-up to full standing with no physical assist and increased encouragement to complete task.   Ambulation/Gait Ambulation/Gait assistance: Min guard Ambulation Distance (Feet): 75 Feet Assistive device: 1 person hand  held assist;2 person hand held assist Gait Pattern/deviations: Decreased stride length;Shuffle;Trunk flexed;Narrow base of support Gait velocity: Decreased Gait velocity interpretation: Below normal speed for age/gender General Gait Details: Pt appeared to enjoy holding therapist and daughter's hands while ambulating. No unsteadiness noted and feel that pt could have ambulated well without any assist. Daughter states pt spends most of her day up walking around the unit at [facility].  Stairs            Wheelchair Mobility    Modified Rankin (Stroke Patients Only)       Balance Overall balance assessment: No apparent balance deficits (not formally assessed)                                           Pertinent Vitals/Pain Vitals stable throughout session.     Home Living Family/patient expects to be discharged to:: Skilled nursing facility                 Additional Comments: Alzheimer's Unit at [facility]    Prior Function Level of Independence: Needs assistance   Gait / Transfers Assistance Needed: Ambulating independently without AD  ADL's / Homemaking Assistance Needed: Assist for bathing/dressing.        Hand Dominance   Dominant Hand: Right    Extremity/Trunk Assessment   Upper Extremity Assessment: Overall WFL for tasks assessed           Lower Extremity Assessment: Overall WFL for tasks  assessed      Cervical / Trunk Assessment: Kyphotic  Communication   Communication: No difficulties  Cognition Arousal/Alertness: Lethargic Behavior During Therapy: WFL for tasks assessed/performed Overall Cognitive Status: Within Functional Limits for tasks assessed                      General Comments      Exercises        Assessment/Plan    PT Assessment Patient needs continued PT services  PT Diagnosis Generalized weakness;Abnormality of gait   PT Problem List Decreased strength;Decreased range of motion;Decreased  activity tolerance;Decreased balance;Decreased mobility;Decreased knowledge of use of DME;Decreased safety awareness;Decreased knowledge of precautions  PT Treatment Interventions DME instruction;Gait training;Stair training;Functional mobility training;Therapeutic activities;Therapeutic exercise;Neuromuscular re-education;Patient/family education   PT Goals (Current goals can be found in the Care Plan section) Acute Rehab PT Goals Patient Stated Goal: Pt unable to state goals at this time.  PT Goal Formulation: With patient Time For Goal Achievement: 12/25/13 Potential to Achieve Goals: Good    Frequency Min 3X/week   Barriers to discharge        Co-evaluation               End of Session Equipment Utilized During Treatment:  (Did not use gait belt as pt appeared fearful of it) Activity Tolerance: Patient tolerated treatment well Patient left: in chair;with chair alarm set;with call bell/phone within reach;with family/visitor present Nurse Communication: Mobility status;Other (comment) (Pocketing food from breakfast. )         Time: 5993-5701 PT Time Calculation (min): 37 min   Charges:   PT Evaluation $Initial PT Evaluation Tier I: 1 Procedure PT Treatments $Gait Training: 8-22 mins $Therapeutic Activity: 8-22 mins   PT G Codes:          Jolyn Lent 12/18/2013, 12:03 PM  Jolyn Lent, PT, DPT Acute Rehabilitation Services Pager: [phone]

## 2013-12-18 NOTE — Progress Notes (Signed)
Subjective: Patient is a poor historian. She does not complain of fever, chills, CP, SOB, N/V, diaphoresis. She was sitting up comfortably in bed eating breakfast.   Objective: Vital signs in last 24 hours: Filed Vitals:   12/17/13 2300 12/17/13 2315 12/17/13 2351 12/18/13 0630  BP: 155/63 127/65 142/79 131/83  Pulse: 73 75 83 77  Temp:   98.1 F (36.7 C) 98 F (36.7 C)  TempSrc:   Oral   Resp: 21 24 20 20   Height:   5\' 2"  (1.575 m)   Weight:   140 lb 11.2 oz (63.821 kg) 136 lb 0.4 oz (61.7 kg)  SpO2: 98% 98% 97% 94%   Weight change:  No intake or output data in the 24 hours ending 12/18/13 0950  Physical Exam General: cooperative, sitting up in bed, NAD HEENT: [place]/AT, EOMI, sclera anicteric, mucus membranes moist Neck: Supple, no JVD CV: RRR, 2/6 systolic ejection murmur Pulm: mild bibasilar crackles, no wheezes or rhonchi Abd: BS+, soft, non-distended, nontender Ext: 2+ pitting edema, warm, moves all  Lab Results: Basic Metabolic Panel:  Recent Labs Lab 12/17/13 1951 12/18/13 0238  NA 137 142  K 3.5* 3.2*  CL 94* 100  CO2 30 30  GLUCOSE 65* 84  BUN 23 21  CREATININE 1.32* 1.22*  CALCIUM 8.9 8.5   Liver Function Tests: No results found for this basename: AST, ALT, ALKPHOS, BILITOT, PROT, ALBUMIN,  in the last 168 hours No results found for this basename: LIPASE, AMYLASE,  in the last 168 hours No results found for this basename: AMMONIA,  in the last 168 hours CBC:  Recent Labs Lab 12/17/13 1951 12/18/13 0238  WBC 3.2* 3.0*  HGB 11.2* 10.5*  HCT 33.7* 32.0*  MCV 86.0 86.3  PLT 219 209   Cardiac Enzymes:  Recent Labs Lab 12/18/13 0238 12/18/13 0820  TROPONINI <0.30 <0.30   BNP:  Recent Labs Lab 12/17/13 1951  PROBNP 17994.0*   D-Dimer: No results found for this basename: DDIMER,  in the last 168 hours CBG: No results found for this basename: GLUCAP,  in the last 168 hours Hemoglobin A1C: No results found for this basename: HGBA1C,   in the last 168 hours Fasting Lipid Panel: No results found for this basename: CHOL, HDL, LDLCALC, TRIG, CHOLHDL, LDLDIRECT,  in the last 168 hours Thyroid Function Tests: No results found for this basename: TSH, T4TOTAL, FREET4, T3FREE, THYROIDAB,  in the last 168 hours Coagulation: No results found for this basename: LABPROT, INR,  in the last 168 hours Anemia Panel: No results found for this basename: VITAMINB12, FOLATE, FERRITIN, TIBC, IRON, RETICCTPCT,  in the last 168 hours Urine Drug Screen: Drugs of Abuse  No results found for this basename: labopia, cocainscrnur, labbenz, amphetmu, thcu, labbarb    Alcohol Level: No results found for this basename: ETH,  in the last 168 hours Urinalysis: No results found for this basename: COLORURINE, APPERANCEUR, LABSPEC, PHURINE, GLUCOSEU, HGBUR, BILIRUBINUR, KETONESUR, PROTEINUR, UROBILINOGEN, NITRITE, LEUKOCYTESUR,  in the last 168 hours   Micro Results: Recent Results (from the past 240 hour(s))  MRSA PCR SCREENING     Status: None   Collection Time    12/18/13  2:20 AM      Result Value Ref Range Status   MRSA by PCR NEGATIVE  NEGATIVE Final   Comment:            The GeneXpert MRSA Assay (FDA     approved for NASAL specimens     only),  is one component of a     comprehensive MRSA colonization     surveillance program. It is not     intended to diagnose MRSA     infection nor to guide or     monitor treatment for     MRSA infections.   Studies/Results: Dg Chest 2 View  12/17/2013   CLINICAL DATA:  Chest pain  EXAM: CHEST  2 VIEW  COMPARISON:  12/15/2013  FINDINGS: Grossly unchanged marked enlargement of the cardiac silhouette and mediastinal contours with atherosclerotic plaque within the thoracic aorta. The pulmonary vasculature appears less distinct than present examination with cephalization of flow. Grossly unchanged small bilateral pleural effusions with associated bibasilar opacities, left greater than right. No new focal  airspace opacities. No pneumothorax. Unchanged bones.  IMPRESSION: Grossly unchanged findings of cardiomegaly, pulmonary edema, small bilateral effusions and associated bibasilar opacities, left greater than right, atelectasis versus infiltrate.   Electronically Signed   By: Sandi Mariscal M.D.   On: 12/17/2013 20:36   Medications: I have reviewed the patient's current medications. Scheduled Meds: . enoxaparin (LOVENOX) injection  40 mg Subcutaneous Q24H  . furosemide  40 mg Intravenous Once  . levothyroxine  88 mcg Oral QAC breakfast  . risperiDONE  0.25 mg Oral BID  . traZODone  150 mg Oral QHS   Continuous Infusions:  PRN Meds:.acetaminophen (TYLENOL) oral liquid 160 mg/5 mL Assessment/Plan: Ms. Dauphinee is a 80yo woman w/ PMHx of HTN, hypothyroidism, Alzheimer's disease, and CHF who presented to the ED from [facility] complaining of chest pain.  1. Acute on Chronic CHF exacerbation likely 2/2 hypothyroidism vs. infection: Pt presented with 3-4+ pitting edema in her lower extremities bilaterally extending up to her knees worsening over the past week. She denies SOB, but has a cough. She is without an official diagnosis of CHF, no previous echocardiograms. Pro-BNP 17,994 without prior comparison. CXR shows cardiomegaly, pulmonary edema, small bilateral effusions and associated bibasilar opacities, left > right. According to her sisters she has about a 25 lb weight gain in the 1- 2 months. Pt also reportedly has been off her synthroid and her TSH was greatly elevated. Pt was restarted on synthroid on 7/15. WBC 3.2 and pt afebrile. Pt is on Lasix 80 mg daily at home. Patient given Lasix 40mg  IV once and ASA 325mg  once. Troponins negative x 2. EKG shows no dynamic changes. This AM, patient denies CP, SOB, palpitations.  - Give another dose of Lasix 40 mg IV once.  - f/u 2D Echocardiogram  - NPO  - I/Os and Daily Weights   2. Hypokalemia: Potassium 3.5 on admission, 3.2 this AM. Pt denies current  chest palpitations, palpitations of shortness of breath. Patient received KCl 40 mEq once.  - Mg ordered   - BMP in AM  3. Hypothyroidism: Pt has history of hypothyroidism, but her synthroid had been stopped previously. TSH was recently rechecked and was very elevated. Pt was restarted on synthroid on 7/15. Untreated hypothyroidism likely contributed to CHF exacerbation.  - Continue Synthroid 88 mcg daily   4. Alzheimer's Disease: Pt very calm and pleasant. Poor historian. Stable per family. Daughters would like pt to return to [place] after hospital stay.  - Continue Risperidal 0.25 mg BID  - Continue Trazodone 150 mg daily  - Pt can get out of bed with assistance only  - PT/OT consulted - Social Work consulted   Diet: DYS 3 DVT/PE ppx: Lovenox 40 mg SQ daily  Dispo: Disposition is deferred  at this time, awaiting improvement of current medical problems. Anticipated discharge in approximately 1-2 day(s).   The patient does have a current PCP Janifer Adie, MD) and does need an [facility] hospital follow-up appointment after discharge.  The patient does not have transportation limitations that hinder transportation to clinic appointments.  .Services Needed at time of discharge: Y = Yes, Blank = No PT:   OT:   RN:   Equipment:   Other:     LOS: 1 day   Albin Felling, MD 12/18/2013, 9:50 AM

## 2013-12-18 NOTE — Progress Notes (Signed)
  Echocardiogram 2D Echocardiogram has been performed.  Mauricio Po 12/18/2013, 6:03 PM

## 2013-12-19 LAB — BASIC METABOLIC PANEL
Anion gap: 12 (ref 5–15)
Anion gap: 13 (ref 5–15)
BUN: 19 mg/dL (ref 6–23)
BUN: 19 mg/dL (ref 6–23)
CHLORIDE: 99 meq/L (ref 96–112)
CHLORIDE: 95 meq/L — AB (ref 96–112)
CO2: 33 meq/L — AB (ref 19–32)
CO2: 32 meq/L (ref 19–32)
CREATININE: 1.25 mg/dL — AB (ref 0.50–1.10)
Calcium: 8.6 mg/dL (ref 8.4–10.5)
Calcium: 8.8 mg/dL (ref 8.4–10.5)
Creatinine, Ser: 1.19 mg/dL — ABNORMAL HIGH (ref 0.50–1.10)
GFR calc Af Amer: 48 mL/min — ABNORMAL LOW (ref >90)
GFR calc Af Amer: 45 mL/min — ABNORMAL LOW (ref >90)
GFR calc non Af Amer: 41 mL/min — ABNORMAL LOW (ref >90)
GFR calc non Af Amer: 39 mL/min — ABNORMAL LOW (ref >90)
GLUCOSE: 83 mg/dL (ref 70–99)
Glucose, Bld: 137 mg/dL — ABNORMAL HIGH (ref 70–99)
POTASSIUM: 3.6 meq/L — AB (ref 3.7–5.3)
Potassium: 3.6 mEq/L — ABNORMAL LOW (ref 3.7–5.3)
SODIUM: 144 meq/L (ref 137–147)
Sodium: 140 mEq/L (ref 137–147)

## 2013-12-19 LAB — CBC
HCT: 33.7 % — ABNORMAL LOW (ref 36.0–46.0)
HEMOGLOBIN: 10.9 g/dL — AB (ref 12.0–15.0)
MCH: 27.7 pg (ref 26.0–34.0)
MCHC: 32.3 g/dL (ref 30.0–36.0)
MCV: 85.8 fL (ref 78.0–100.0)
Platelets: 219 10*3/uL (ref 150–400)
RBC: 3.93 MIL/uL (ref 3.87–5.11)
RDW: 14.2 % (ref 11.5–15.5)
WBC: 3.3 10*3/uL — ABNORMAL LOW (ref 4.0–10.5)

## 2013-12-19 MED ORDER — FUROSEMIDE 10 MG/ML IJ SOLN
40.0000 mg | Freq: Once | INTRAMUSCULAR | Status: AC
  Administered 2013-12-19: 40 mg via INTRAVENOUS
  Filled 2013-12-19: qty 4

## 2013-12-19 MED ORDER — METOPROLOL TARTRATE 12.5 MG HALF TABLET
12.5000 mg | ORAL_TABLET | Freq: Two times a day (BID) | ORAL | Status: DC
  Administered 2013-12-19 – 2013-12-21 (×5): 12.5 mg via ORAL
  Filled 2013-12-19 (×6): qty 1

## 2013-12-19 MED ORDER — ENOXAPARIN SODIUM 30 MG/0.3ML ~~LOC~~ SOLN
30.0000 mg | SUBCUTANEOUS | Status: DC
  Administered 2013-12-20 – 2013-12-21 (×2): 30 mg via SUBCUTANEOUS
  Filled 2013-12-19 (×2): qty 0.3

## 2013-12-19 MED ORDER — LISINOPRIL 5 MG PO TABS
5.0000 mg | ORAL_TABLET | Freq: Every day | ORAL | Status: DC
  Administered 2013-12-19 – 2013-12-20 (×2): 5 mg via ORAL
  Filled 2013-12-19 (×3): qty 1

## 2013-12-19 NOTE — Progress Notes (Signed)
Subjective: Per nurse, patient was complaining of left shoulder and back pain. Patient states she has no complaints today.   Objective: Vital signs in last 24 hours: Filed Vitals:   12/18/13 1348 12/18/13 2137 12/19/13 0126 12/19/13 0553  BP: 142/89 139/72 151/90 137/72  Pulse: 88 86 87 73  Temp: 97.8 F (36.6 C) 98.2 F (36.8 C) 97.4 F (36.3 C) 97.9 F (36.6 C)  TempSrc: Oral Oral Oral Oral  Resp: 20 20 16 20   Height:      Weight:    125 lb 10.6 oz (57 kg)  SpO2: 100% 94% 97% 97%   Weight change: -15 lb 0.6 oz (-6.821 kg)  Intake/Output Summary (Last 24 hours) at 12/19/13 0941 Last data filed at 12/19/13 0851  Gross per 24 hour  Intake    720 ml  Output      0 ml  Net    720 ml   Physical Exam General: cooperative, sitting up in bed eating breakfast, NAD  HEENT: [place]/AT, EOMI, sclera anicteric, mucus membranes moist  Neck: Supple, no JVD  CV: RRR, 2/6 systolic ejection murmur  Pulm: mild bibasilar crackles, no wheezes or rhonchi  Abd: BS+, soft, non-distended, nontender  Ext: 2+ pitting edema in lower extremities, warm, moves all  Lab Results: Basic Metabolic Panel:  Recent Labs Lab 12/18/13 0238 12/18/13 0820 12/18/13 1836 12/19/13 0441  NA 142  --  141 144  K 3.2*  --  3.5* 3.6*  CL 100  --  98 99  CO2 30  --  32 33*  GLUCOSE 84  --  90 83  BUN 21  --  20 19  CREATININE 1.22*  --  1.25* 1.19*  CALCIUM 8.5  --  8.8 8.6  MG  --  1.6  --   --    Liver Function Tests: No results found for this basename: AST, ALT, ALKPHOS, BILITOT, PROT, ALBUMIN,  in the last 168 hours No results found for this basename: LIPASE, AMYLASE,  in the last 168 hours No results found for this basename: AMMONIA,  in the last 168 hours CBC:  Recent Labs Lab 12/18/13 0238 12/19/13 0441  WBC 3.0* 3.3*  HGB 10.5* 10.9*  HCT 32.0* 33.7*  MCV 86.3 85.8  PLT 209 219   Cardiac Enzymes:  Recent Labs Lab 12/18/13 0238 12/18/13 0820 12/18/13 1200  TROPONINI <0.30 <0.30  <0.30   BNP:  Recent Labs Lab 12/17/13 1951  PROBNP 17994.0*   D-Dimer: No results found for this basename: DDIMER,  in the last 168 hours CBG: No results found for this basename: GLUCAP,  in the last 168 hours Hemoglobin A1C: No results found for this basename: HGBA1C,  in the last 168 hours Fasting Lipid Panel: No results found for this basename: CHOL, HDL, LDLCALC, TRIG, CHOLHDL, LDLDIRECT,  in the last 168 hours Thyroid Function Tests: No results found for this basename: TSH, T4TOTAL, FREET4, T3FREE, THYROIDAB,  in the last 168 hours Coagulation: No results found for this basename: LABPROT, INR,  in the last 168 hours Anemia Panel: No results found for this basename: VITAMINB12, FOLATE, FERRITIN, TIBC, IRON, RETICCTPCT,  in the last 168 hours Urine Drug Screen: Drugs of Abuse  No results found for this basename: labopia, cocainscrnur, labbenz, amphetmu, thcu, labbarb    Alcohol Level: No results found for this basename: ETH,  in the last 168 hours Urinalysis: No results found for this basename: COLORURINE, APPERANCEUR, LABSPEC, PHURINE, GLUCOSEU, HGBUR, BILIRUBINUR, KETONESUR, PROTEINUR, UROBILINOGEN, NITRITE,  LEUKOCYTESUR,  in the last 168 hours   Micro Results: Recent Results (from the past 240 hour(s))  MRSA PCR SCREENING     Status: None   Collection Time    12/18/13  2:20 AM      Result Value Ref Range Status   MRSA by PCR NEGATIVE  NEGATIVE Final   Comment:            The GeneXpert MRSA Assay (FDA     approved for NASAL specimens     only), is one component of a     comprehensive MRSA colonization     surveillance program. It is not     intended to diagnose MRSA     infection nor to guide or     monitor treatment for     MRSA infections.   Studies/Results: Dg Chest 2 View  12/17/2013   CLINICAL DATA:  Chest pain  EXAM: CHEST  2 VIEW  COMPARISON:  12/15/2013  FINDINGS: Grossly unchanged marked enlargement of the cardiac silhouette and mediastinal contours  with atherosclerotic plaque within the thoracic aorta. The pulmonary vasculature appears less distinct than present examination with cephalization of flow. Grossly unchanged small bilateral pleural effusions with associated bibasilar opacities, left greater than right. No new focal airspace opacities. No pneumothorax. Unchanged bones.  IMPRESSION: Grossly unchanged findings of cardiomegaly, pulmonary edema, small bilateral effusions and associated bibasilar opacities, left greater than right, atelectasis versus infiltrate.   Electronically Signed   By: Sandi Mariscal M.D.   On: 12/17/2013 20:36   Medications: I have reviewed the patient's current medications. Scheduled Meds: . enoxaparin (LOVENOX) injection  40 mg Subcutaneous Q24H  . furosemide  40 mg Intravenous Once  . levothyroxine  88 mcg Oral QAC breakfast  . risperiDONE  0.25 mg Oral BID  . traZODone  150 mg Oral QHS   Continuous Infusions:  PRN Meds:.acetaminophen (TYLENOL) oral liquid 160 mg/5 mL Assessment/Plan: Ms. Gaumond is a 80yo woman w/ PMHx of HTN, hypothyroidism, Alzheimer's disease, and CHF who presented to the ED from [facility] complaining of chest pain.   1. Acute on Chronic CHF exacerbation likely 2/2 hypothyroidism vs. infection: Pt presented with 3-4+ pitting edema in her lower extremities bilaterally extending up to her knees worsening over the past week. She denies SOB, but has a cough. She is without an official diagnosis of CHF, no previous echocardiograms. Pro-BNP 17,994 without prior comparison. CXR shows cardiomegaly, pulmonary edema, small bilateral effusions and associated bibasilar opacities, left > right. According to her sisters she has about a 25 lb weight gain in the 1- 2 months. Pt also reportedly has been off her synthroid and her TSH was greatly elevated. Pt was restarted on synthroid on 7/15. WBC 3.2 and pt afebrile. Pt is on Lasix 80 mg daily at home. Patient given Lasix 40mg  IV once and ASA 325mg  once.  Troponins negative x 2. EKG shows no dynamic changes. This AM, patient denies CP, SOB, palpitations.  - Repeat Lasix 40 mg IV once, Cr stable at 1.19 and K 3.6 - Repeat BMP at 18:00  - f/u 2D Echocardiogram  - I/Os and Daily Weights--> pt down 15 lb from admission weight. Will continue to diurese since lower extremities continue to have 2+ pitting edema  2. Hypokalemia: Potassium 3.5 on admission, 3.2>3.5>3.6  this AM. Pt denies current chest palpitations, palpitations of shortness of breath. Repeat BMP yesterday showed K 3.5, KCl 40 mEq given. Repeat BMP this AM shows K 3.6.  -  Mg 1.6 - Repeat BMP at 18:00 --> will replete K if necessary - Repeat BMP in AM  3. Hypothyroidism: Pt has history of hypothyroidism, but her synthroid had been stopped previously. TSH was recently rechecked and was very elevated. Pt was restarted on synthroid on 7/15. Untreated hypothyroidism likely contributed to CHF exacerbation.  - Continue Synthroid 88 mcg daily   4. Alzheimer's Disease: Pt very calm and pleasant. Poor historian. Stable per family. Daughters would like pt to return to [place] after hospital stay.  - Continue Risperidal 0.25 mg BID  - Continue Trazodone 150 mg daily  - Pt can get out of bed with assistance only  - PT/OT consulted --> Recommend SNF, 24 hour supervision/assistance - Social Work consulted   Diet: DYS 3  DVT/PE ppx: Lovenox 40 mg SQ daily  Dispo: Disposition is deferred at this time, awaiting improvement of current medical problems. Anticipated discharge in approximately 1-2 day(s).    The patient does have a current PCP Janifer Adie, MD) and does need an [facility] hospital follow-up appointment after discharge.  The patient does not have transportation limitations that hinder transportation to clinic appointments.  .Services Needed at time of discharge: Y = Yes, Blank = No PT:   OT:   RN:   Equipment:   Other:     LOS: 2 days   Albin Felling, MD 12/19/2013, 9:41 AM

## 2013-12-19 NOTE — Clinical Social Work Psychosocial (Signed)
Clinical Social Work Department BRIEF PSYCHOSOCIAL ASSESSMENT 12/19/2013  Patient:  BOBBI, YOUNT     Account Number:  [id number]     Admit date:  12/17/2013  Clinical Social Worker:  Hubert Azure  Date/Time:  12/19/2013 05:56 PM  Referred by:  CSW  Date Referred:  12/19/2013 Referred for  Other - See comment   Other Referral:   From [facility]   Interview type:  Family Other interview type:   Patient daughter Enid Derry    PSYCHOSOCIAL DATA Living Status:  FACILITY Admitted from facility:  Other Level of care:  Assisted Living Primary support name:  Lodema Pilot [phone] (cell) Primary support relationship to patient:  CHILD, ADULT Degree of support available:   Good. Patient daughter present at bedside.    CURRENT CONCERNS Current Concerns  Post-Acute Placement   Other Concerns:    SOCIAL WORK ASSESSMENT / PLAN CSW met with patient daughter Enid Derry) who was present at bedside as patient was pleasantly confused. CSW introduced self and explained role. Per Enid Derry, patient has been residing at [facility] [phone], since November 02, 2013. Daughter reported patient does not require any assistance to ambulate. Enid Derry stated plan is for patient to return to [facility] once medically stable.   Assessment/plan status:  Other - See comment Other assessment/ plan:   CSW to update FL2 for return to facility.   Information/referral to community resources:    PATIENT'S/FAMILY'S RESPONSE TO PLAN OF CARE: Patient's daughter was pleasant and cooperative during discussion.   [place], [place] Weekend Clinical Social Worker [phone]

## 2013-12-20 ENCOUNTER — Encounter (HOSPITAL_COMMUNITY): Payer: Self-pay | Admitting: *Deleted

## 2013-12-20 LAB — BASIC METABOLIC PANEL
ANION GAP: 14 (ref 5–15)
BUN: 20 mg/dL (ref 6–23)
CHLORIDE: 96 meq/L (ref 96–112)
CO2: 31 mEq/L (ref 19–32)
Calcium: 8.8 mg/dL (ref 8.4–10.5)
Creatinine, Ser: 1.26 mg/dL — ABNORMAL HIGH (ref 0.50–1.10)
GFR calc Af Amer: 45 mL/min — ABNORMAL LOW (ref >90)
GFR calc non Af Amer: 39 mL/min — ABNORMAL LOW (ref >90)
Glucose, Bld: 85 mg/dL (ref 70–99)
POTASSIUM: 3.5 meq/L — AB (ref 3.7–5.3)
Sodium: 141 mEq/L (ref 137–147)

## 2013-12-20 MED ORDER — METOPROLOL TARTRATE 12.5 MG HALF TABLET: 12.5000 mg | ORAL_TABLET | Freq: Two times a day (BID) | ORAL | Status: AC

## 2013-12-20 MED ORDER — FUROSEMIDE 10 MG/ML IJ SOLN
40.0000 mg | Freq: Once | INTRAMUSCULAR | Status: AC
  Administered 2013-12-20: 40 mg via INTRAVENOUS

## 2013-12-20 MED ORDER — LISINOPRIL 5 MG PO TABS: 5.0000 mg | ORAL_TABLET | Freq: Every day | ORAL | Status: DC

## 2013-12-20 NOTE — Progress Notes (Signed)
Subjective: Patient has no complaints today. Lower extremities still have 2+ pitting edema, but improving. Will continue Lasix.  Objective: Vital signs in last 24 hours: Filed Vitals:   12/19/13 0553 12/19/13 1353 12/19/13 2214 12/20/13 0558  BP: 137/72 133/79 145/83 133/70  Pulse: 73 82 84 74  Temp: 97.9 F (36.6 C) 97.6 F (36.4 C) 98.4 F (36.9 C) 97.6 F (36.4 C)  TempSrc: Oral Oral Oral Oral  Resp: 20 20 20 18   Height:      Weight: 125 lb 10.6 oz (57 kg)   123 lb 14.4 oz (56.2 kg)  SpO2: 97% 98% 96% 97%   Weight change: -1 lb 12.2 oz (-0.8 kg)  Intake/Output Summary (Last 24 hours) at 12/20/13 1020 Last data filed at 12/20/13 5427  Gross per 24 hour  Intake    720 ml  Output      0 ml  Net    720 ml   Physical Exam General: cooperative, sitting up in bed, NAD  HEENT: [place]/AT, EOMI, sclera anicteric, mucus membranes moist  Neck: Supple, no JVD  CV: RRR, 2/6 systolic ejection murmur  Pulm: CTA bilaterally, no wheezes or rhonchi  Abd: BS+, soft, non-distended, nontender  Ext: 2+ pitting edema in lower extremities, warm, moves all     Lab Results: Basic Metabolic Panel:  Recent Labs Lab 12/18/13 0238 12/18/13 0820  12/19/13 1900 12/20/13 0308  NA 142  --   < > 140 141  K 3.2*  --   < > 3.6* 3.5*  CL 100  --   < > 95* 96  CO2 30  --   < > 32 31  GLUCOSE 84  --   < > 137* 85  BUN 21  --   < > 19 20  CREATININE 1.22*  --   < > 1.25* 1.26*  CALCIUM 8.5  --   < > 8.8 8.8  MG  --  1.6  --   --   --   < > = values in this interval not displayed. Liver Function Tests: No results found for this basename: AST, ALT, ALKPHOS, BILITOT, PROT, ALBUMIN,  in the last 168 hours No results found for this basename: LIPASE, AMYLASE,  in the last 168 hours No results found for this basename: AMMONIA,  in the last 168 hours CBC:  Recent Labs Lab 12/18/13 0238 12/19/13 0441  WBC 3.0* 3.3*  HGB 10.5* 10.9*  HCT 32.0* 33.7*  MCV 86.3 85.8  PLT 209 219   Cardiac  Enzymes:  Recent Labs Lab 12/18/13 0238 12/18/13 0820 12/18/13 1200  TROPONINI <0.30 <0.30 <0.30   BNP:  Recent Labs Lab 12/17/13 1951  PROBNP 17994.0*   D-Dimer: No results found for this basename: DDIMER,  in the last 168 hours CBG: No results found for this basename: GLUCAP,  in the last 168 hours Hemoglobin A1C: No results found for this basename: HGBA1C,  in the last 168 hours Fasting Lipid Panel: No results found for this basename: CHOL, HDL, LDLCALC, TRIG, CHOLHDL, LDLDIRECT,  in the last 168 hours Thyroid Function Tests: No results found for this basename: TSH, T4TOTAL, FREET4, T3FREE, THYROIDAB,  in the last 168 hours Coagulation: No results found for this basename: LABPROT, INR,  in the last 168 hours Anemia Panel: No results found for this basename: VITAMINB12, FOLATE, FERRITIN, TIBC, IRON, RETICCTPCT,  in the last 168 hours Urine Drug Screen: Drugs of Abuse  No results found for this basename: labopia, cocainscrnur, labbenz,  amphetmu, thcu, labbarb    Alcohol Level: No results found for this basename: ETH,  in the last 168 hours Urinalysis: No results found for this basename: COLORURINE, APPERANCEUR, LABSPEC, [place], GLUCOSEU, HGBUR, BILIRUBINUR, KETONESUR, PROTEINUR, UROBILINOGEN, NITRITE, LEUKOCYTESUR,  in the last 168 hours   Micro Results: Recent Results (from the past 240 hour(s))  MRSA PCR SCREENING     Status: None   Collection Time    12/18/13  2:20 AM      Result Value Ref Range Status   MRSA by PCR NEGATIVE  NEGATIVE Final   Comment:            The GeneXpert MRSA Assay (FDA     approved for NASAL specimens     only), is one component of a     comprehensive MRSA colonization     surveillance program. It is not     intended to diagnose MRSA     infection nor to guide or     monitor treatment for     MRSA infections.   Studies/Results: No results found. Medications: I have reviewed the patient's current medications. Scheduled Meds: .  enoxaparin (LOVENOX) injection  30 mg Subcutaneous Q24H  . furosemide  40 mg Intravenous Once  . levothyroxine  88 mcg Oral QAC breakfast  . lisinopril  5 mg Oral Daily  . metoprolol tartrate  12.5 mg Oral BID  . risperiDONE  0.25 mg Oral BID  . traZODone  150 mg Oral QHS   Continuous Infusions:  PRN Meds:.acetaminophen (TYLENOL) oral liquid 160 mg/5 mL Assessment/Plan: Jenny Reyes is a 80yo woman w/ PMHx of HTN, hypothyroidism, Alzheimer's disease, and CHF who presented to the ED from [facility] complaining of chest pain.   1. Acute on Chronic CHF exacerbation likely 2/2 hypothyroidism vs. infection: Pt presented with 3-4+ pitting edema in her lower extremities bilaterally extending up to her knees worsening over the past week. She denies SOB, but has a cough. Pro-BNP 17,994 without prior comparison. CXR shows cardiomegaly, pulmonary edema, small bilateral effusions and associated bibasilar opacities, left > right. According to her sisters she has about a 25 lb weight gain in the 1- 2 months. Pt also reportedly has been off her synthroid and her TSH was greatly elevated. Pt was restarted on synthroid on 7/15. WBC 3.2 and pt afebrile. Pt is on Lasix 80 mg daily at home. Patient given Lasix 40mg  IV once and ASA 325mg  once. Troponins negative x 2. EKG shows no dynamic changes. This AM, patient denies CP, SOB, palpitations. Echo on 7/17 showed severe hypokinesis of entire myocardium, EF 20-25%, grade II diastolic dysfunction. - Repeat Lasix 40 mg IV once, Cr stable at 1.26 and K 3.5  - I/Os and Daily Weights--> pt down 17 lb from admission weight. Will continue to diurese since lower extremities continue to have 2+ pitting edema   2. Hypokalemia: Potassium 3.5 on admission, 3.2>3.5>3.6>>3.5 this AM. Pt denies current chest palpitations, palpitations of shortness of breath. Patient was last given KCl 40 mEq on 7/17.  - Mg 1.6  - Repeat BMP in AM   3. Hypothyroidism: Pt has history of  hypothyroidism, but her synthroid had been stopped previously. TSH was recently rechecked and was very elevated. Pt was restarted on synthroid on 7/15. Untreated hypothyroidism likely contributed to CHF exacerbation.  - Continue Synthroid 88 mcg daily   4. Alzheimer's Disease: Pt very calm and pleasant. Poor historian. Stable per family. Daughters would like pt to return to [employer]  after hospital stay.  - Continue Risperidal 0.25 mg BID  - Continue Trazodone 150 mg daily  - Pt can get out of bed with assistance only  - PT/OT consulted --> Recommend SNF, 24 hour supervision/assistance  - Social Work consulted   Diet: DYS 1 DVT/PE ppx: Lovenox 40 mg SQ daily  Dispo: [facility] to [facility] likely tomorrow.   The patient does have a current PCP Jenny Adie, MD) and does need an [facility] hospital follow-up appointment after discharge.  The patient does not have transportation limitations that hinder transportation to clinic appointments.  .Services Needed at time of discharge: Y = Yes, Blank = No PT:   OT:   RN:   Equipment:   Other:     LOS: 3 days   Albin Felling, MD 12/20/2013, 10:20 AM

## 2013-12-20 NOTE — Clinical Social Work Note (Signed)
CSW made aware by RN of patient d/c to [facility]. CSW contacted Nanine Means [phone]) and was informed by RN Santa Genera, patient has to be re-assessed prior to returning to facility. Santa Genera stated she will follow-up with assessment on Monday, 7/20. CSW made RN aware. RN made MD aware. CSW to follow tomorrow.   [place], [place] Weekend Clinical Social Worker [phone]

## 2013-12-20 NOTE — Discharge Summary (Addendum)
Name: Jenny Reyes MRN: [id number] DOB: [birth year] 80 y.o. PCP: Jenny Adie, MD  Date of Admission: 12/17/2013  6:25 PM Date of Discharge: 12/21/2013 Attending Physician: Annia Belt, MD  Discharge Diagnosis: 1. Acute on Chronic CHF Exacerbation 2. Hypokalemia- Resolved 3. Hypothyroidism 4. Alzheimer's Disease  Discharge Medications:   Medication List         ACETAMINOPHEN EXTRA STRENGTH 167 MG/5ML Liqd  Generic drug:  Acetaminophen  Take 15 mLs by mouth every 6 (six) hours as needed (for pain).     furosemide 40 MG tablet  Commonly known as:  LASIX  Take 1 tablet (40 mg total) by mouth 2 (two) times daily.     levothyroxine 88 MCG tablet  Commonly known as:  SYNTHROID, LEVOTHROID  Take 88 mcg by mouth daily before breakfast.     losartan 25 MG tablet  Commonly known as:  COZAAR  Take 1 tablet (25 mg total) by mouth daily.     metoprolol tartrate 12.5 mg Tabs tablet  Commonly known as:  LOPRESSOR  Take 0.5 tablets (12.5 mg total) by mouth 2 (two) times daily.     risperiDONE 1 MG/ML oral solution  Commonly known as:  RISPERDAL  Take 0.25 mg by mouth 2 (two) times daily.     traZODone 150 MG tablet  Commonly known as:  DESYREL  Take 150 mg by mouth at bedtime.        Disposition and follow-up:   [name] was discharged from [facility] in Good condition.  At the hospital follow up visit please address:  1.  New medications, Metoprolol and Losartan. Patient will need outpatient appointment with cardiology for newly diagnosed CHF.  2.  Labs / imaging needed at time of follow-up: None  3.  Pending labs/ test needing follow-up: None  Follow-up Appointments:   Discharge Instructions: Discharge Instructions   Diet - low sodium heart healthy    Complete by:  As directed      Diet - low sodium heart healthy    Complete by:  As directed      Increase activity slowly    Complete by:  As directed      Increase  activity slowly    Complete by:  As directed            Consultations:  None  Procedures Performed:  Dg Chest 2 View  12/17/2013   CLINICAL DATA:  Chest pain  EXAM: CHEST  2 VIEW  COMPARISON:  12/15/2013  FINDINGS: Grossly unchanged marked enlargement of the cardiac silhouette and mediastinal contours with atherosclerotic plaque within the thoracic aorta. The pulmonary vasculature appears less distinct than present examination with cephalization of flow. Grossly unchanged small bilateral pleural effusions with associated bibasilar opacities, left greater than right. No new focal airspace opacities. No pneumothorax. Unchanged bones.  IMPRESSION: Grossly unchanged findings of cardiomegaly, pulmonary edema, small bilateral effusions and associated bibasilar opacities, left greater than right, atelectasis versus infiltrate.   Electronically Signed   By: Sandi Mariscal M.D.   On: 12/17/2013 20:36   Dg Chest 2 View  12/15/2013   CLINICAL DATA:  Edema of the lower legs, cough, confusion  EXAM: CHEST  2 VIEW  COMPARISON:  None.  FINDINGS: There is moderate cardiomegaly present. Haziness at both lung bases is consistent with atelectasis and probable small pleural effusions left greater than right. There may be mild pulmonary vascular congestion present. The bones are osteopenic.  IMPRESSION: Cardiomegaly with small effusions  and mild pulmonary vascular congestion most consistent with mild CHF.   Electronically Signed   By: Ivar Drape M.D.   On: 12/15/2013 14:16    2D Echo on 12/18/13:  Left ventricle: The cavity size was normal. Wall thickness was normal. Systolic function was severely reduced. The estimated ejection fraction was in the range of 20% to 25%. Severe hypokinesis of the entire myocardium. Features are consistent with a pseudonormal left ventricular filling pattern, with concomitant abnormal relaxation and increased filling pressure (grade 2 diastolic dysfunction). - Mitral valve: There was  moderate to severe regurgitation. - Left atrium: The atrium was severely dilated. - Right ventricle: The cavity size was mildly dilated. Systolic function was mildly reduced. - Right atrium: The atrium was mildly dilated. - Pulmonary arteries: Systolic pressure was moderately increased. PA peak pressure: 50 mm Hg (S). - Pericardium, extracardiac: A trivial pericardial effusion was identified. There was a left pleural effusion.   Admission HPI: [name] is a 80 y.o. woman with PMHx of HTN, hypothyroidism, CHF, and Alzheimer's disease who presents to the ED from [facility] with complaint of chest pain. Pt has Alzheimer's disease and is a poor historian. HPI is taken mostly from daughters, ED note and some from the patient. At [facility] assisted living facility, nurses noticed the pt grabbing her chest and complaining of chest pain which is atypical for her. She also appeared to have trouble breathing. Nurses called EMS to bring her into the ED. Per daughters, pt has had increased cough and leg swelling for the past week. She was given a Z-pack recently out of concern for infection. Pt does not have an official diagnosis of CHF. Pt denies chest pain, shortness of breath, nausea, vomiting, diarrhea, fever or chill. She states she feels fine.    Hospital Course by problem list:  1. Acute on Chronic CHF Exacerbation: Pt presented with 3-4+ pitting edema in her lower extremities bilaterally extending up to her knees worsening over the past week. She denies SOB, but has a cough. Pro-BNP 17,994 without prior comparison. CXR shows cardiomegaly, pulmonary edema, small bilateral effusions and associated bibasilar opacities, left > right. According to her sisters she has about a 25 lb weight gain in the 1- 2 months. Pt also reportedly has been off her synthroid and her TSH was greatly elevated. Pt was restarted on synthroid on 7/15. WBC 3.2 and pt afebrile. Pt is on Lasix 80 mg daily at home. Troponins negative  x 3. EKG shows no dynamic changes. Echo on 7/17 showed severe hypokinesis of entire myocardium, EF 20-25%, grade II diastolic dysfunction. Patient was diuresed with Lasix 40 mg IV daily. Potassium and Creatinine closely monitored. This AM, she denies CP, SOB. Her lower extremity edema is much improved, 1+ pitting edema. She is down 22 lbs from her admission weight. Patient was started on Metoprolol 12.5 mg BID and Losartan 25 mg daily to optimize her CHF. She will be discharged on Lasix 40 mg BID.   2. Hypokalemia- Resolved: Potassium 3.5 on admission, 3.2>3.5>3.6>>3.5>>4.3 this AM. Pt denies current chest palpitations, palpitations of shortness of breath. Patient was last given KCl 40 mEq on 7/17. Potassium stable.  3. Hypothyroidism: Pt has history of hypothyroidism, but her synthroid had been stopped previously. TSH was recently rechecked and was very elevated. Pt was restarted on synthroid on 7/15. Untreated hypothyroidism likely contributed to CHF exacerbation. She was continued on home dose of Synthroid 88 mcg daily.  4. Alzheimer's Disease: Pt very calm and pleasant. Poor  historian. Stable per family. She was continued on Risperidal 0.25 mg BID and Trazodone 150 mg daily.    Discharge Vitals:   BP 149/77  Pulse 80  Temp(Src) 98.1 F (36.7 C) (Oral)  Resp 17  Ht 5\' 2"  (1.575 m)  Wt 118 lb 9.7 oz (53.8 kg)  BMI 21.69 kg/m2  SpO2 97% Physical Exam  General: cooperative, sitting up in bed, NAD  HEENT: [place]/AT, EOMI, sclera anicteric, mucus membranes moist  Neck: Supple, no JVD  CV: RRR, 2/6 systolic ejection murmur  Pulm: CTA bilaterally, no wheezes or rhonchi  Abd: BS+, soft, non-distended, nontender  Ext: 1+ pitting edema in lower extremities, warm, moves all   Discharge Labs:  Results for orders placed during the hospital encounter of 12/17/13 (from the past 24 hour(s))  BASIC METABOLIC PANEL     Status: Abnormal   Collection Time    12/21/13  3:50 AM      Result Value Ref Range     Sodium 139  137 - 147 mEq/L   Potassium 4.3  3.7 - 5.3 mEq/L   Chloride 97  96 - 112 mEq/L   CO2 29  19 - 32 mEq/L   Glucose, Bld 105 (*) 70 - 99 mg/dL   BUN 20  6 - 23 mg/dL   Creatinine, Ser 1.00  0.50 - 1.10 mg/dL   Calcium 8.7  8.4 - 10.5 mg/dL   GFR calc non Af Amer 51 (*) >90 mL/min   GFR calc Af Amer 59 (*) >90 mL/min   Anion gap 13  5 - 15    Signed: Albin Felling, MD 12/21/2013, 11:43 AM    Services Ordered on Discharge: None Equipment Ordered on Discharge: None

## 2013-12-20 NOTE — Progress Notes (Signed)
Patient ID: Jenny Reyes, female   DOB: [birth year], 80 y.o.   MRN: [id number] Attending physician discharge note: History, physical findings, hospital course, management and discharge plans, accurate as recorded by a resident physician Dr. Albin Felling . Summary: Pleasant 80 year old woman who does not have much of a database in our current system. She is moderately demented and cannot give a good history. She resides in a long term care facility. She has a very supportive family who provides background information. She was transferred here for further evaluation of dyspnea and chest pain. Of note was an approximate 30 pound weight gain and increasing peripheral edema. She was ruled out for an MI. There was evidence for acute congestive heart failure with a BNP of 18,000 units. Marked cardiomegaly on chest radiograph. She responded very rapidly to parenteral diuretics. Admission weight 140 pounds; discharge weight 123 pounds. Echocardiogram showed a significant dilated cardiomyopathy with generalized hypokinesis of the entire heart and an estimated ejection fraction of 20-25%. Moderate to severe mitral regurgitation, severe left atrial dilatation, mildly dilated right ventricle with mild reduction in right ventricular function, moderate increase in pulmonary artery pressure at 50 mm. She is discharged back to the extended care facility in good condition.

## 2013-12-21 LAB — BASIC METABOLIC PANEL
Anion gap: 13 (ref 5–15)
BUN: 20 mg/dL (ref 6–23)
CHLORIDE: 97 meq/L (ref 96–112)
CO2: 29 mEq/L (ref 19–32)
CREATININE: 1 mg/dL (ref 0.50–1.10)
Calcium: 8.7 mg/dL (ref 8.4–10.5)
GFR calc non Af Amer: 51 mL/min — ABNORMAL LOW (ref >90)
GFR, EST AFRICAN AMERICAN: 59 mL/min — AB (ref >90)
Glucose, Bld: 105 mg/dL — ABNORMAL HIGH (ref 70–99)
Potassium: 4.3 mEq/L (ref 3.7–5.3)
Sodium: 139 mEq/L (ref 137–147)

## 2013-12-21 MED ORDER — FUROSEMIDE 40 MG PO TABS
40.0000 mg | ORAL_TABLET | Freq: Two times a day (BID) | ORAL | Status: DC
  Administered 2013-12-21: 40 mg via ORAL
  Filled 2013-12-21 (×3): qty 1

## 2013-12-21 MED ORDER — LOSARTAN POTASSIUM 25 MG PO TABS: 25.0000 mg | ORAL_TABLET | Freq: Every day | ORAL | Status: DC

## 2013-12-21 MED ORDER — LOSARTAN POTASSIUM 25 MG PO TABS
25.0000 mg | ORAL_TABLET | Freq: Every day | ORAL | Status: DC
  Administered 2013-12-21: 25 mg via ORAL
  Filled 2013-12-21: qty 1

## 2013-12-21 MED ORDER — FUROSEMIDE 40 MG PO TABS: 40.0000 mg | ORAL_TABLET | Freq: Two times a day (BID) | ORAL | Status: AC

## 2013-12-21 NOTE — Progress Notes (Signed)
PT Cancellation Note  Patient Details Name: Charmelle Soh MRN: [id number] DOB: May 07, 1929   Cancelled Treatment:    Reason Eval/Treat Not Completed: Per chart review, pt to d/c today back to SNF with [facility]. Will defer further treatment to SNF, however if discharge is delayed PT will see tomorrow.   Jolyn Lent 12/21/2013, 2:21 PM  Jolyn Lent, PT, DPT Acute Rehabilitation Services Pager: [phone]

## 2013-12-21 NOTE — Progress Notes (Signed)
DC IV, DC to SNF. Non-Tele. Discharge instructions prepared by Education officer, museum. Patient leaving unit via wheelchair. No signs or symptoms of distress or discomfort.

## 2013-12-21 NOTE — Progress Notes (Signed)
Co-signed for Irfat Habib RN/BSN for medication administration, assessments, Is&Os, Vital Signs, Progress Notes, Care Plans, and Patient education. Khalani Novoa M RN/BSN 

## 2013-12-21 NOTE — Progress Notes (Signed)
OT Cancellation Note  Patient Details Name: Jenny Reyes MRN: [id number] DOB: 09/28/1928   Cancelled Treatment:    Reason Eval/Treat Not Completed: Other (comment) Pt is [facility] (which is Medicare per SW) and current D/C plan is SNF or back to ALF (depending on whether they can meet her needs, they are to reassess today). No apparent immediate acute care OT needs, therefore will defer OT to SNF/ALF. If OT eval is needed please call Acute Rehab Dept. at [phone] or text page OT at [phone].    Almon Register [phone] 12/21/2013, 11:43 AM

## 2013-12-21 NOTE — Discharge Summary (Signed)
I personally examined this patient on day of planned discharge and reviewed discharge plans with the resident physicians Dr Algis Liming and [place].

## 2013-12-21 NOTE — Progress Notes (Signed)
Subjective: Patient feeling well today. Denies CP, SOB. She was not able to be discharged yesterday due to [facility] having to re-assessed prior to returning. Reassessment to be done today. Her PCP at [facility] was contacted and agrees that patient can be sent back to [facility]. PCP states that she will follow up with social work.    Objective: Vital signs in last 24 hours: Filed Vitals:   12/20/13 0558 12/20/13 1336 12/20/13 2056 12/21/13 0542  BP: 133/70 140/71 131/62 147/73  Pulse: 74 86 71 73  Temp: 97.6 F (36.4 C) 98.7 F (37.1 C) 98.4 F (36.9 C) 98.1 F (36.7 C)  TempSrc: Oral Oral Oral Oral  Resp: 18 20 18 17   Height:      Weight: 123 lb 14.4 oz (56.2 kg)   118 lb 9.7 oz (53.8 kg)  SpO2: 97% 99% 94% 97%   Weight change: -5 lb 4.7 oz (-2.4 kg)  Intake/Output Summary (Last 24 hours) at 12/21/13 0853 Last data filed at 12/20/13 2055  Gross per 24 hour  Intake    480 ml  Output      0 ml  Net    480 ml   Physical Exam General: cooperative, sitting up in bed, NAD  HEENT: [place]/AT, EOMI, sclera anicteric, mucus membranes moist  Neck: Supple, no JVD  CV: RRR, 2/6 systolic ejection murmur  Pulm: CTA bilaterally, no wheezes or rhonchi  Abd: BS+, soft, non-distended, nontender  Ext: 1+ pitting edema in lower extremities, warm, moves all   Lab Results: Basic Metabolic Panel:  Recent Labs Lab 12/18/13 0238 12/18/13 0820  12/20/13 0308 12/21/13 0350  NA 142  --   < > 141 139  K 3.2*  --   < > 3.5* 4.3  CL 100  --   < > 96 97  CO2 30  --   < > 31 29  GLUCOSE 84  --   < > 85 105*  BUN 21  --   < > 20 20  CREATININE 1.22*  --   < > 1.26* 1.00  CALCIUM 8.5  --   < > 8.8 8.7  MG  --  1.6  --   --   --   < > = values in this interval not displayed. Liver Function Tests: No results found for this basename: AST, ALT, ALKPHOS, BILITOT, PROT, ALBUMIN,  in the last 168 hours No results found for this basename: LIPASE, AMYLASE,  in the last 168 hours No results found for this basename:  AMMONIA,  in the last 168 hours CBC:  Recent Labs Lab 12/18/13 0238 12/19/13 0441  WBC 3.0* 3.3*  HGB 10.5* 10.9*  HCT 32.0* 33.7*  MCV 86.3 85.8  PLT 209 219   Cardiac Enzymes:  Recent Labs Lab 12/18/13 0238 12/18/13 0820 12/18/13 1200  TROPONINI <0.30 <0.30 <0.30   BNP:  Recent Labs Lab 12/17/13 1951  PROBNP 17994.0*   D-Dimer: No results found for this basename: DDIMER,  in the last 168 hours CBG: No results found for this basename: GLUCAP,  in the last 168 hours Hemoglobin A1C: No results found for this basename: HGBA1C,  in the last 168 hours Fasting Lipid Panel: No results found for this basename: CHOL, HDL, LDLCALC, TRIG, CHOLHDL, LDLDIRECT,  in the last 168 hours Thyroid Function Tests: No results found for this basename: TSH, T4TOTAL, FREET4, T3FREE, THYROIDAB,  in the last 168 hours Coagulation: No results found for this basename: LABPROT, INR,  in the last  168 hours Anemia Panel: No results found for this basename: VITAMINB12, FOLATE, FERRITIN, TIBC, IRON, RETICCTPCT,  in the last 168 hours Urine Drug Screen: Drugs of Abuse  No results found for this basename: labopia, cocainscrnur, labbenz, amphetmu, thcu, labbarb    Alcohol Level: No results found for this basename: ETH,  in the last 168 hours Urinalysis: No results found for this basename: COLORURINE, APPERANCEUR, LABSPEC, PHURINE, GLUCOSEU, HGBUR, BILIRUBINUR, KETONESUR, PROTEINUR, UROBILINOGEN, NITRITE, LEUKOCYTESUR,  in the last 168 hours   Micro Results: Recent Results (from the past 240 hour(s))  MRSA PCR SCREENING     Status: None   Collection Time    12/18/13  2:20 AM      Result Value Ref Range Status   MRSA by PCR NEGATIVE  NEGATIVE Final   Comment:            The GeneXpert MRSA Assay (FDA     approved for NASAL specimens     only), is one component of a     comprehensive MRSA colonization     surveillance program. It is not     intended to diagnose MRSA     infection nor to  guide or     monitor treatment for     MRSA infections.   Studies/Results: No results found. Medications: I have reviewed the patient's current medications. Scheduled Meds: . enoxaparin (LOVENOX) injection  30 mg Subcutaneous Q24H  . levothyroxine  88 mcg Oral QAC breakfast  . lisinopril  5 mg Oral Daily  . metoprolol tartrate  12.5 mg Oral BID  . risperiDONE  0.25 mg Oral BID  . traZODone  150 mg Oral QHS   Continuous Infusions:  PRN Meds:.acetaminophen (TYLENOL) oral liquid 160 mg/5 mL Assessment/Plan: Jenny Reyes is a 80yo woman w/ PMHx of HTN, hypothyroidism, Alzheimer's disease, and CHF who presented to the ED from [facility] complaining of chest pain.   1. Acute on Chronic CHF exacerbation likely 2/2 hypothyroidism vs. infection: Pt presented with 3-4+ pitting edema in her lower extremities bilaterally extending up to her knees worsening over the past week. She denies SOB, but has a cough. Pro-BNP 17,994 without prior comparison. CXR shows cardiomegaly, pulmonary edema, small bilateral effusions and associated bibasilar opacities, left > right. According to her sisters she has about a 25 lb weight gain in the 1- 2 months. Pt also reportedly has been off her synthroid and her TSH was greatly elevated. Pt was restarted on synthroid on 7/15. WBC 3.2 and pt afebrile. Pt is on Lasix 80 mg daily at home. Patient given Lasix 40mg  IV once and ASA 325mg  once. Troponins negative x 2. EKG shows no dynamic changes. This AM, patient denies CP, SOB, palpitations. Echo on 7/17 showed severe hypokinesis of entire myocardium, EF 20-25%, grade II diastolic dysfunction. Patient started on Metoprolol and Lisinopril to optimize her heart failure. - Switched to Lasix 40 mg PO today, Cr stable at 1.00 and K 4.3  - Lisinopril switched to Losartan 25mg  daily because of dry cough - I/Os and Daily Weights--> pt down 22 lb from admission weight. Pt placed back on home Lasix.  2. Hypokalemia: Potassium 3.5 on  admission, 3.2>3.5>3.6>>3.5>>4.3 this AM. Pt denies current chest palpitations, palpitations of shortness of breath. Patient was last given KCl 40 mEq on 7/17.  - Mg 1.6   3. Hypothyroidism: Pt has history of hypothyroidism, but her synthroid had been stopped previously. TSH was recently rechecked and was very elevated. Pt was restarted on synthroid  on 7/15. Untreated hypothyroidism likely contributed to CHF exacerbation.  - Continue Synthroid 88 mcg daily   4. Alzheimer's Disease: Pt very calm and pleasant. Poor historian. Stable per family. Daughters would like pt to return to [place] after hospital stay.  - Continue Risperidal 0.25 mg BID  - Continue Trazodone 150 mg daily  - Pt can get out of bed with assistance only  - PT/OT consulted --> Recommend SNF, 24 hour supervision/assistance  - Social Work consulted   Diet: DYS 1  DVT/PE ppx: Lovenox 40 mg SQ daily  [facility]po: [facility] to [facility] today.    The patient does have a current PCP Jenny Adie, MD) and does need an [facility] hospital follow-up appointment after discharge.  The patient does not have transportation limitations that hinder transportation to clinic appointments.  .Services Needed at time of discharge: Y = Yes, Blank = No PT:   OT:   RN:   Equipment:   Other:     LOS: 4 days   Albin Felling, MD 12/21/2013, 8:53 AM

## 2013-12-21 NOTE — Discharge Instructions (Signed)
It was a pleasure taking care of you, Jenny Reyes. Please take your Lasix 40 mg BID and newly prescribed medications, Metoprolol 12.5 mg BID and Losartan 25 mg daily. You will also need an outpatient appointment with a cardiologist for your heart failure.

## 2013-12-22 NOTE — Progress Notes (Signed)
OK per MD for d/c today; CSW spoke multiple times with daughter Enid Derry and she discussed with her sister Caren Griffins.  [facility] SW and representative from [facility] discussed patient's current level of care needs and determined that patient would be safe to return to ALF with outpatient PT services arranged via [facility].  Patient is alert and oriented to person only but she was anxious to leave the hospital.  Nursing notified. [facility] arranged for a driver to pick up patient to to transport back to ALF.  CSW signing off.  Lorie Phenix. Pauline Good, [place]

## 2014-01-23 ENCOUNTER — Emergency Department (HOSPITAL_COMMUNITY): Payer: Medicare (Managed Care)

## 2014-01-23 ENCOUNTER — Encounter (HOSPITAL_COMMUNITY): Payer: Self-pay | Admitting: Emergency Medicine

## 2014-01-23 ENCOUNTER — Inpatient Hospital Stay (HOSPITAL_COMMUNITY)
Admission: EM | Admit: 2014-01-23 | Discharge: 2014-01-25 | DRG: 292 | Disposition: A | Discharge status: Skilled Nursing Facility | Payer: Medicare (Managed Care) | Attending: Internal Medicine | Admitting: Internal Medicine

## 2014-01-23 DIAGNOSIS — I5023 Acute on chronic systolic (congestive) heart failure: Secondary | ICD-10-CM | POA: Diagnosis not present

## 2014-01-23 DIAGNOSIS — R079 Chest pain, unspecified: Secondary | ICD-10-CM | POA: Diagnosis not present

## 2014-01-23 DIAGNOSIS — I509 Heart failure, unspecified: Secondary | ICD-10-CM | POA: Diagnosis present

## 2014-01-23 DIAGNOSIS — E039 Hypothyroidism, unspecified: Secondary | ICD-10-CM | POA: Diagnosis present

## 2014-01-23 DIAGNOSIS — E876 Hypokalemia: Secondary | ICD-10-CM | POA: Diagnosis present

## 2014-01-23 DIAGNOSIS — F0281 Dementia in other diseases classified elsewhere with behavioral disturbance: Secondary | ICD-10-CM | POA: Diagnosis present

## 2014-01-23 DIAGNOSIS — F0390 Unspecified dementia without behavioral disturbance: Secondary | ICD-10-CM

## 2014-01-23 DIAGNOSIS — Z79899 Other long term (current) drug therapy: Secondary | ICD-10-CM

## 2014-01-23 DIAGNOSIS — F02818 Dementia in other diseases classified elsewhere, unspecified severity, with other behavioral disturbance: Secondary | ICD-10-CM | POA: Diagnosis present

## 2014-01-23 DIAGNOSIS — I10 Essential (primary) hypertension: Secondary | ICD-10-CM | POA: Diagnosis present

## 2014-01-23 DIAGNOSIS — F0280 Dementia in other diseases classified elsewhere without behavioral disturbance: Secondary | ICD-10-CM | POA: Diagnosis present

## 2014-01-23 DIAGNOSIS — G309 Alzheimer's disease, unspecified: Secondary | ICD-10-CM | POA: Diagnosis present

## 2014-01-23 DIAGNOSIS — F0391 Unspecified dementia with behavioral disturbance: Secondary | ICD-10-CM | POA: Diagnosis present

## 2014-01-23 DIAGNOSIS — F03918 Unspecified dementia, unspecified severity, with other behavioral disturbance: Secondary | ICD-10-CM

## 2014-01-23 LAB — BASIC METABOLIC PANEL
ANION GAP: 13 (ref 5–15)
BUN: 35 mg/dL — AB (ref 6–23)
CHLORIDE: 103 meq/L (ref 96–112)
CO2: 26 meq/L (ref 19–32)
Calcium: 9.1 mg/dL (ref 8.4–10.5)
Creatinine, Ser: 1.24 mg/dL — ABNORMAL HIGH (ref 0.50–1.10)
GFR calc Af Amer: 46 mL/min — ABNORMAL LOW (ref >90)
GFR calc non Af Amer: 39 mL/min — ABNORMAL LOW (ref >90)
Glucose, Bld: 77 mg/dL (ref 70–99)
POTASSIUM: 3.4 meq/L — AB (ref 3.7–5.3)
Sodium: 142 mEq/L (ref 137–147)

## 2014-01-23 LAB — CBC WITH DIFFERENTIAL/PLATELET
BASOS PCT: 1 % (ref 0–1)
Basophils Absolute: 0 10*3/uL (ref 0.0–0.1)
Eosinophils Absolute: 0 10*3/uL (ref 0.0–0.7)
Eosinophils Relative: 1 % (ref 0–5)
HEMATOCRIT: 32.2 % — AB (ref 36.0–46.0)
HEMOGLOBIN: 10.7 g/dL — AB (ref 12.0–15.0)
Lymphocytes Relative: 28 % (ref 12–46)
Lymphs Abs: 1.1 10*3/uL (ref 0.7–4.0)
MCH: 28.3 pg (ref 26.0–34.0)
MCHC: 33.2 g/dL (ref 30.0–36.0)
MCV: 85.2 fL (ref 78.0–100.0)
MONOS PCT: 10 % (ref 3–12)
Monocytes Absolute: 0.4 10*3/uL (ref 0.1–1.0)
NEUTROS ABS: 2.4 10*3/uL (ref 1.7–7.7)
NEUTROS PCT: 60 % (ref 43–77)
Platelets: 189 10*3/uL (ref 150–400)
RBC: 3.78 MIL/uL — ABNORMAL LOW (ref 3.87–5.11)
RDW: 16.2 % — ABNORMAL HIGH (ref 11.5–15.5)
WBC: 4 10*3/uL (ref 4.0–10.5)

## 2014-01-23 LAB — I-STAT TROPONIN, ED: Troponin i, poc: 0.02 ng/mL (ref 0.00–0.08)

## 2014-01-23 LAB — PRO B NATRIURETIC PEPTIDE: PRO B NATRI PEPTIDE: 23639 pg/mL — AB (ref 0–450)

## 2014-01-23 LAB — TROPONIN I: Troponin I: <0.3 ng/mL (ref <0.30)

## 2014-01-23 LAB — MRSA PCR SCREENING: MRSA BY PCR: NEGATIVE

## 2014-01-23 MED ORDER — ONDANSETRON HCL 4 MG/2ML IJ SOLN: 4.0000 mg | Freq: Four times a day (QID) | INTRAMUSCULAR | Status: DC | PRN

## 2014-01-23 MED ORDER — ACETAMINOPHEN 167 MG/5ML PO LIQD: 15.0000 mL | Freq: Four times a day (QID) | ORAL | Status: DC | PRN

## 2014-01-23 MED ORDER — TRAZODONE HCL 150 MG PO TABS
150.0000 mg | ORAL_TABLET | Freq: Every day | ORAL | Status: DC
  Administered 2014-01-23 – 2014-01-24 (×2): 150 mg via ORAL
  Filled 2014-01-23 (×3): qty 1

## 2014-01-23 MED ORDER — METOPROLOL TARTRATE 12.5 MG HALF TABLET
12.5000 mg | ORAL_TABLET | Freq: Two times a day (BID) | ORAL | Status: DC
  Administered 2014-01-23 – 2014-01-25 (×4): 12.5 mg via ORAL
  Filled 2014-01-23 (×5): qty 1

## 2014-01-23 MED ORDER — ACETAMINOPHEN 160 MG/5ML PO SOLN
500.0000 mg | Freq: Four times a day (QID) | ORAL | Status: DC | PRN
  Filled 2014-01-23: qty 20

## 2014-01-23 MED ORDER — LEVOTHYROXINE SODIUM 88 MCG PO TABS
88.0000 ug | ORAL_TABLET | Freq: Every day | ORAL | Status: DC
  Administered 2014-01-24: 88 ug via ORAL
  Filled 2014-01-23 (×2): qty 1

## 2014-01-23 MED ORDER — SODIUM CHLORIDE 0.9 % IJ SOLN
3.0000 mL | Freq: Two times a day (BID) | INTRAMUSCULAR | Status: DC
  Administered 2014-01-23 – 2014-01-25 (×4): 3 mL via INTRAVENOUS

## 2014-01-23 MED ORDER — SPIRONOLACTONE 12.5 MG HALF TABLET
12.5000 mg | ORAL_TABLET | Freq: Every day | ORAL | Status: DC
  Administered 2014-01-23 – 2014-01-25 (×3): 12.5 mg via ORAL
  Filled 2014-01-23 (×3): qty 1

## 2014-01-23 MED ORDER — RISPERIDONE 1 MG/ML PO SOLN
0.2500 mg | Freq: Two times a day (BID) | ORAL | Status: DC
  Administered 2014-01-23 – 2014-01-25 (×4): 0.25 mg via ORAL
  Filled 2014-01-23 (×5): qty 0.25

## 2014-01-23 MED ORDER — ONDANSETRON HCL 4 MG PO TABS: 4.0000 mg | ORAL_TABLET | Freq: Four times a day (QID) | ORAL | Status: DC | PRN

## 2014-01-23 MED ORDER — HEPARIN SODIUM (PORCINE) 5000 UNIT/ML IJ SOLN
5000.0000 [IU] | Freq: Three times a day (TID) | INTRAMUSCULAR | Status: DC
  Administered 2014-01-23 – 2014-01-25 (×6): 5000 [IU] via SUBCUTANEOUS
  Filled 2014-01-23 (×7): qty 1

## 2014-01-23 MED ORDER — LOSARTAN POTASSIUM 25 MG PO TABS
25.0000 mg | ORAL_TABLET | Freq: Every day | ORAL | Status: DC
  Administered 2014-01-23 – 2014-01-24 (×2): 25 mg via ORAL
  Filled 2014-01-23 (×2): qty 1

## 2014-01-23 MED ORDER — FUROSEMIDE 10 MG/ML IJ SOLN
40.0000 mg | Freq: Once | INTRAMUSCULAR | Status: AC
  Administered 2014-01-23: 40 mg via INTRAVENOUS
  Filled 2014-01-23: qty 4

## 2014-01-23 NOTE — H&P (Signed)
Date: 01/23/2014               Patient Name:  Jenny Reyes MRN: [id number]  DOB: 1929/02/05 Age / Sex: 80 y.o., female   PCP: Janifer Adie, MD         Medical Service: Internal Medicine Teaching Service         Attending Physician: Dr. Karren Cobble, MD    First Contact: Dr. Charlott Rakes Pager: [phone]  Second Contact: Dr. Adele Barthel Pager: [phone]       After Hours (After 5p/  First Contact Pager: [phone]  weekends / holidays): Second Contact Pager: [phone]   Chief Complaint: chest pain and wheezing   History of Present Illness:   Jenny Reyes is a 80 year old female with a history of congestive heart failure (EF 20-25%), Alzheimer's disease, hypertension, hypothyroidism, who presents from her nursing home with reports of chest pain and wheezing. Patient is unable to provide history herself, and history is provided by her daughter. Her daughter reports that she was sent from the nursing home, but is unclear about the details. She denies any signs that the patient has had chest pain or wheezing since she has seen her in the hospital. The patient also denies any current complaints, though questioning is severely limited by the patient's dementia.  Of note, patient had a recent admission from 12/17/2013 to 12/21/2013 for an acute on chronic CHF exacerbation that was remarkable for increasing bilateral lower extremity edema. At that point, patient had a reported 25 pound weight gain 1-2 months prior to presentation and was down 22 pounds by the time of discharge. She was also found to have an elevated TSH in the context of having stopped her Synthroid.  Since her discharge, patient is been taking her Synthroid as administered by her nursing home and has gained 6 pounds.  Meds: Current Facility-Administered Medications  Medication Dose Route Frequency Provider Last Rate Last Dose  . acetaminophen (TYLENOL) solution 500 mg  500 mg Oral Q6H PRN Bartholomew Crews, MD        . heparin injection 5,000 Units  5,000 Units Subcutaneous 3 times per day Clinton Gallant, MD      . Derrill Memo ON 01/24/2014] levothyroxine (SYNTHROID, LEVOTHROID) tablet 88 mcg  88 mcg Oral QAC breakfast Clinton Gallant, MD      . losartan (COZAAR) tablet 25 mg  25 mg Oral Daily Clinton Gallant, MD      . metoprolol tartrate (LOPRESSOR) tablet 12.5 mg  12.5 mg Oral BID Clinton Gallant, MD      . ondansetron [facility]) tablet 4 mg  4 mg Oral Q6H PRN Clinton Gallant, MD       Or  . ondansetron (ZOFRAN) injection 4 mg  4 mg Intravenous Q6H PRN Clinton Gallant, MD      . risperiDONE (RISPERDAL) 1 MG/ML oral solution 0.25 mg  0.25 mg Oral BID Clinton Gallant, MD      . sodium chloride 0.9 % injection 3 mL  3 mL Intravenous Q12H Clinton Gallant, MD      . spironolactone (ALDACTONE) tablet 12.5 mg  12.5 mg Oral Daily Clinton Gallant, MD      . traZODone (DESYREL) tablet 150 mg  150 mg Oral QHS Clinton Gallant, MD        Allergies: Allergies as of 01/23/2014  . (No Known Allergies)   Past Medical History  Diagnosis Date  . Memory loss   . Thyroid disease   . Alzheimer disease  Past Surgical History  Procedure Laterality Date  . Thyroid surgery    . Abdominal hysterectomy    . Mouth surgery     History reviewed. No pertinent family history. History   Social History  . Marital Status: Widowed    Spouse Name: N/A    Number of Children: N/A  . Years of Education: N/A   Occupational History  . Not on file.   Social History Main Topics  . Smoking status: Never Smoker   . Smokeless tobacco: Not on file  . Alcohol Use: No  . Drug Use: Not on file  . Sexual Activity: Not on file   Other Topics Concern  . Not on file   Social History Narrative  . No narrative on file    Review of Systems: Review of systems not obtained due to patient factors.  Physical Exam: Blood pressure 127/68, pulse 77, temperature 99.5 F (37.5 C), temperature source Oral, resp. rate 18, height 5\' 2"  (1.575 m), weight 57.108 kg (125 lb 14.4 oz), SpO2  100.00%.  General: resting in bed HEENT: PERRL, EOMI, no scleral icterus Cardiac: RRR, no rubs or gallops,  2/6 systolic ejection murmur Pulm: clear to auscultation bilaterally, moving normal volumes of air Abd: soft, nontender, nondistended, BS present Ext: warm and well perfused, 1+ edema bilateral lower extremities, dry skin, no lesions or wounds on bilateral feet Neuro: alert, cranial nerves II-XII grossly intact  Lab results: Basic Metabolic Panel:  Recent Labs  01/23/14 1642  NA 142  K 3.4*  CL 103  CO2 26  GLUCOSE 77  BUN 35*  CREATININE 1.24*  CALCIUM 9.1   CBC:  Recent Labs  01/23/14 1642  WBC 4.0  NEUTROABS 2.4  HGB 10.7*  HCT 32.2*  MCV 85.2  PLT 189   Cardiac Enzymes:  Recent Labs  01/23/14 2147  TROPONINI <0.30   BNP:  Recent Labs  01/23/14 1642  PROBNP 23639.0*   D-Dimer: No results found for this basename: DDIMER,  in the last 72 hours CBG: No results found for this basename: GLUCAP,  in the last 72 hours Hemoglobin A1C: No results found for this basename: HGBA1C,  in the last 72 hours Fasting Lipid Panel: No results found for this basename: CHOL, HDL, LDLCALC, TRIG, CHOLHDL, LDLDIRECT,  in the last 72 hours Thyroid Function Tests: No results found for this basename: TSH, T4TOTAL, FREET4, T3FREE, THYROIDAB,  in the last 72 hours Anemia Panel: No results found for this basename: VITAMINB12, FOLATE, FERRITIN, TIBC, IRON, RETICCTPCT,  in the last 72 hours Coagulation: No results found for this basename: LABPROT, INR,  in the last 72 hours Urine Drug Screen: Drugs of Abuse  No results found for this basename: labopia,  cocainscrnur,  labbenz,  amphetmu,  thcu,  labbarb    Urinalysis: No results found for this basename: COLORURINE, APPERANCEUR, LABSPEC, PHURINE, GLUCOSEU, HGBUR, BILIRUBINUR, KETONESUR, PROTEINUR, UROBILINOGEN, NITRITE, LEUKOCYTESUR,  in the last 72 hours   Imaging results:  Dg Chest 2 View  01/23/2014   CLINICAL  DATA:  Cough, shortness of Breath  EXAM: CHEST  2 VIEW  COMPARISON:  7/ 16/15  FINDINGS: Cardiomegaly. Central mild vascular congestion without convincing pulmonary edema. There is small left pleural effusion with left basilar atelectasis or infiltrate.  IMPRESSION: Cardiomegaly. No pulmonary edema. Small left pleural effusion with left basilar atelectasis or infiltrate.   Electronically Signed   By: Lahoma Crocker M.D.   On: 01/23/2014 17:55    Other results: EKG: normal EKG,  normal sinus rhythm, unchanged from previous tracings.  Assessment & Plan by Problem: Principal Problem:   CHF exacerbation Active Problems:   Dementia with behavioral disturbance   Unspecified hypothyroidism   Essential hypertension, benign   CHF (congestive heart failure)  Ms. Saksa is a 80 year old female with a history of congestive heart failure (EF 20-25%), Alzheimer's disease, hypertension, hypothyroidism, who presents from her nursing home with reports of chest pain and wheezing, though actual symptomatology from nursing home is unclear. Concern for acute exacerbation of congestive heart failure.  # Acute exacerbation of congestive heart failure:  Physical exam not remarkable for rales, lower extremity edema.The last discharge weight (12/21/13) was 118 pounds, currently 125 pounds. BNP 23,639, up from 17,994 from 1 month ago. Chest x-ray remarkable for a small left pleural effusion.  EKG unremarkable for any signs of ischemia. Troponin negative x1.  - trend troponins x 2  - 40 mg lasix given in ED  - daily weights  - strict ins and outs, foley  - repeat EKG in the morning  - continue losartan, metop, and spironolactone  # Hypothyroidism: Patient has reportedly been compliant with Synthroid.  - TSH in the morning  # Dementia  - Continue home trazadone and risperidone  Dispo: Disposition is deferred at this time, awaiting improvement of current medical problems. Anticipated discharge in approximately 2 day(s).     The patient does have a current PCP Janifer Adie, MD) and does need an [facility] hospital follow-up appointment after discharge.  The patient does not have transportation limitations that hinder transportation to clinic appointments.  Signed: Luan Moore, MD 01/23/2014, 10:38 PM

## 2014-01-23 NOTE — ED Notes (Signed)
Iv therapy called 

## 2014-01-23 NOTE — ED Notes (Signed)
attempted to give report

## 2014-01-23 NOTE — ED Provider Notes (Signed)
CSN: [id number]     Arrival date & time 01/23/14  1500 History   First MD Initiated Contact with Patient 01/23/14 [phone]     Chief Complaint  Patient presents with  . Abdominal Pain     (Consider location/radiation/quality/duration/timing/severity/associated sxs/prior Treatment) HPI Comments: Patient presents with shortness of breath and chest pain. She has a history of dementia so history is limited. Per the nursing facility and her [facility] care provider, she's had some worsening shortness of breath on exertion over the last week. She was complaining of some chest pain today. She has a history of congestive heart failure with an EF of around 20%. It was reported that she's been gaining weight over the last week and has had some increased swelling of her legs. Patient currently denies any symptoms.  Patient is a 80 y.o. female presenting with abdominal pain.  Abdominal Pain   Past Medical History  Diagnosis Date  . Memory loss   . Thyroid disease   . Alzheimer disease    Past Surgical History  Procedure Laterality Date  . Thyroid surgery    . Abdominal hysterectomy    . Mouth surgery     History reviewed. No pertinent family history. History  Substance Use Topics  . Smoking status: Never Smoker   . Smokeless tobacco: Not on file  . Alcohol Use: No   OB History   Grav Para Term Preterm Abortions TAB SAB Ect Mult Living                 Review of Systems  Unable to perform ROS: Dementia  Gastrointestinal: Positive for abdominal pain.      Allergies  Review of patient's allergies indicates no known allergies.  Home Medications   Prior to Admission medications   Medication Sig Start Date End Date Taking? Authorizing Provider  furosemide (LASIX) 40 MG tablet Take 1 tablet (40 mg total) by mouth 2 (two) times daily. 12/21/13  Yes Carly Rivet, MD  levothyroxine (SYNTHROID, LEVOTHROID) 88 MCG tablet Take 88 mcg by mouth daily before breakfast.   Yes Historical Provider, MD   losartan (COZAAR) 25 MG tablet Take 1 tablet (25 mg total) by mouth daily. 12/21/13  Yes Albin Felling, MD  metoprolol tartrate (LOPRESSOR) 12.5 mg TABS tablet Take 0.5 tablets (12.5 mg total) by mouth 2 (two) times daily. 12/20/13  Yes Albin Felling, MD  risperiDONE (RISPERDAL) 1 MG/ML oral solution Take 0.25 mg by mouth 2 (two) times daily.   Yes Historical Provider, MD  spironolactone (ALDACTONE) 12.5 mg TABS tablet Take 12.5 mg by mouth daily.   Yes Historical Provider, MD  traZODone (DESYREL) 150 MG tablet Take 150 mg by mouth at bedtime.   Yes Historical Provider, MD  Acetaminophen (ACETAMINOPHEN EXTRA STRENGTH) 167 MG/5ML LIQD Take 15 mLs by mouth every 6 (six) hours as needed (for pain).    Historical Provider, MD   BP 169/87  Pulse 70  Temp(Src) 98.4 F (36.9 C) (Oral)  Resp 19  Ht 5\' 2"  (1.575 m)  Wt 125 lb (56.7 kg)  BMI 22.86 kg/m2  SpO2 97% Physical Exam  Constitutional: She appears well-developed and well-nourished.  HENT:  Head: Normocephalic and atraumatic.  Eyes: Pupils are equal, round, and reactive to light.  Neck: Normal range of motion. Neck supple.  Cardiovascular: Normal rate and regular rhythm.   Murmur heard. Pulmonary/Chest: Effort normal. No respiratory distress. She has no wheezes. She has rales. She exhibits no tenderness.  Abdominal: Soft. Bowel sounds are normal.  There is no tenderness. There is no rebound and no guarding.  Musculoskeletal: Normal range of motion. She exhibits edema (2-3+ pitting edema bilaterally).  Lymphadenopathy:    She has no cervical adenopathy.  Neurological: She is alert.  Confused  Skin: Skin is warm and dry. No rash noted.  Psychiatric: She has a normal mood and affect.    ED Course  Procedures (including critical care time) Labs Review Results for orders placed during the hospital encounter of 01/23/14  CBC WITH DIFFERENTIAL      Result Value Ref Range   WBC 4.0  4.0 - 10.5 K/uL   RBC 3.78 (*) 3.87 - 5.11 MIL/uL    Hemoglobin 10.7 (*) 12.0 - 15.0 g/dL   HCT 32.2 (*) 36.0 - 46.0 %   MCV 85.2  78.0 - 100.0 fL   MCH 28.3  26.0 - 34.0 pg   MCHC 33.2  30.0 - 36.0 g/dL   RDW 16.2 (*) 11.5 - 15.5 %   Platelets 189  150 - 400 K/uL   Neutrophils Relative % 60  43 - 77 %   Neutro Abs 2.4  1.7 - 7.7 K/uL   Lymphocytes Relative 28  12 - 46 %   Lymphs Abs 1.1  0.7 - 4.0 K/uL   Monocytes Relative 10  3 - 12 %   Monocytes Absolute 0.4  0.1 - 1.0 K/uL   Eosinophils Relative 1  0 - 5 %   Eosinophils Absolute 0.0  0.0 - 0.7 K/uL   Basophils Relative 1  0 - 1 %   Basophils Absolute 0.0  0.0 - 0.1 K/uL  BASIC METABOLIC PANEL      Result Value Ref Range   Sodium 142  137 - 147 mEq/L   Potassium 3.4 (*) 3.7 - 5.3 mEq/L   Chloride 103  96 - 112 mEq/L   CO2 26  19 - 32 mEq/L   Glucose, Bld 77  70 - 99 mg/dL   BUN 35 (*) 6 - 23 mg/dL   Creatinine, Ser 1.24 (*) 0.50 - 1.10 mg/dL   Calcium 9.1  8.4 - 10.5 mg/dL   GFR calc non Af Amer 39 (*) >90 mL/min   GFR calc Af Amer 46 (*) >90 mL/min   Anion gap 13  5 - 15  PRO B NATRIURETIC PEPTIDE      Result Value Ref Range   Pro B Natriuretic peptide (BNP) 23639.0 (*) 0 - 450 pg/mL  I-STAT TROPOININ, ED      Result Value Ref Range   Troponin i, poc 0.02  0.00 - 0.08 ng/mL   Comment 3            No results found.   Imaging Review Dg Chest 2 View  01/23/2014   CLINICAL DATA:  Cough, shortness of Breath  EXAM: CHEST  2 VIEW  COMPARISON:  7/ 16/15  FINDINGS: Cardiomegaly. Central mild vascular congestion without convincing pulmonary edema. There is small left pleural effusion with left basilar atelectasis or infiltrate.  IMPRESSION: Cardiomegaly. No pulmonary edema. Small left pleural effusion with left basilar atelectasis or infiltrate.   Electronically Signed   By: Lahoma Crocker M.D.   On: 01/23/2014 17:55     EKG Interpretation   Date/Time:  Saturday January 23 2014 15:12:22 EDT Ventricular Rate:  77 PR Interval:  195 QRS Duration: 101 QT Interval:  426 QTC  Calculation: 482 R Axis:   2 Text Interpretation:  Sinus rhythm Left atrial enlargement LVH with  secondary repolarization abnormality Artifact in lead(s) I II aVR aVL  since last tracing no significant change Confirmed by Shiya Fogelman  MD, Rebel Laughridge  ([id number]) on 01/23/2014 5:54:06 PM      MDM   Final diagnoses:  Acute on chronic congestive heart failure, unspecified congestive heart failure type    Patient presents with symptoms consistent with a CHF exacerbation. Her BNP is markedly elevated. Her chest x-ray did not demonstrate pulmonary edema. She was given a dose of Lasix in the ED. Her blood pressure is stable. I consulted with the internal medicine teaching service who will admit the patient.    Malvin Johns, MD 01/23/14 [phone]

## 2014-01-23 NOTE — ED Notes (Signed)
Attempted to give report 

## 2014-01-23 NOTE — ED Notes (Signed)
Pt at long term dementia facility and co to their staff of epigastric pain per medics pt denies any pain or discomfort as does on arrival to er

## 2014-01-24 DIAGNOSIS — Z79899 Other long term (current) drug therapy: Secondary | ICD-10-CM | POA: Diagnosis not present

## 2014-01-24 DIAGNOSIS — I509 Heart failure, unspecified: Secondary | ICD-10-CM | POA: Diagnosis present

## 2014-01-24 DIAGNOSIS — F02818 Dementia in other diseases classified elsewhere, unspecified severity, with other behavioral disturbance: Secondary | ICD-10-CM | POA: Diagnosis present

## 2014-01-24 DIAGNOSIS — E876 Hypokalemia: Secondary | ICD-10-CM | POA: Diagnosis not present

## 2014-01-24 DIAGNOSIS — F0281 Dementia in other diseases classified elsewhere with behavioral disturbance: Secondary | ICD-10-CM | POA: Diagnosis present

## 2014-01-24 DIAGNOSIS — F0280 Dementia in other diseases classified elsewhere without behavioral disturbance: Secondary | ICD-10-CM | POA: Diagnosis present

## 2014-01-24 DIAGNOSIS — I10 Essential (primary) hypertension: Secondary | ICD-10-CM | POA: Diagnosis present

## 2014-01-24 DIAGNOSIS — E039 Hypothyroidism, unspecified: Secondary | ICD-10-CM | POA: Diagnosis present

## 2014-01-24 DIAGNOSIS — I5023 Acute on chronic systolic (congestive) heart failure: Secondary | ICD-10-CM | POA: Diagnosis present

## 2014-01-24 DIAGNOSIS — R079 Chest pain, unspecified: Secondary | ICD-10-CM | POA: Diagnosis present

## 2014-01-24 LAB — BASIC METABOLIC PANEL
ANION GAP: 12 (ref 5–15)
BUN: 31 mg/dL — ABNORMAL HIGH (ref 6–23)
CHLORIDE: 105 meq/L (ref 96–112)
CO2: 29 mEq/L (ref 19–32)
CREATININE: 1.28 mg/dL — AB (ref 0.50–1.10)
Calcium: 9.1 mg/dL (ref 8.4–10.5)
GFR, EST AFRICAN AMERICAN: 44 mL/min — AB (ref >90)
GFR, EST NON AFRICAN AMERICAN: 38 mL/min — AB (ref >90)
Glucose, Bld: 92 mg/dL (ref 70–99)
Potassium: 3.3 mEq/L — ABNORMAL LOW (ref 3.7–5.3)
Sodium: 146 mEq/L (ref 137–147)

## 2014-01-24 LAB — TSH: TSH: 45.1 u[IU]/mL — AB (ref 0.350–4.500)

## 2014-01-24 LAB — TROPONIN I: Troponin I: <0.3 ng/mL (ref <0.30)

## 2014-01-24 MED ORDER — LEVOTHYROXINE SODIUM 100 MCG PO TABS
100.0000 ug | ORAL_TABLET | Freq: Every day | ORAL | Status: DC
  Administered 2014-01-25: 100 ug via ORAL
  Filled 2014-01-24 (×2): qty 1

## 2014-01-24 MED ORDER — LOSARTAN POTASSIUM 50 MG PO TABS
50.0000 mg | ORAL_TABLET | Freq: Every day | ORAL | Status: DC
  Administered 2014-01-25: 50 mg via ORAL
  Filled 2014-01-24: qty 1

## 2014-01-24 MED ORDER — FUROSEMIDE 10 MG/ML IJ SOLN
40.0000 mg | Freq: Once | INTRAMUSCULAR | Status: AC
  Administered 2014-01-24: 40 mg via INTRAVENOUS
  Filled 2014-01-24: qty 4

## 2014-01-24 NOTE — Progress Notes (Signed)
Utilization Review Completed.   Britney Newstrom, RN, BSN Nurse Case Manager  

## 2014-01-24 NOTE — Progress Notes (Signed)
Patient arrived from ED via stretcher. Alert to self only. Incontinent of urine. SR on telemetry. No complaints of pain. Daughter with patient on arrival to unit.

## 2014-01-24 NOTE — H&P (Signed)
Internal Medicine Attending Admission Note Date: 01/24/2014  Patient name: Hulda Reddix Medical record number: [id number] Date of birth: [birth year] Age: 80 y.o. Gender: female  I saw and evaluated the patient. I reviewed the resident's note and I agree with the resident's findings and plan as documented in the resident's note.  Chief Complaint(s): None  History - key components related to admission:  Ms. Truluck is an 80 year old woman with a history of chronic systolic heart failure with an ejection fraction of 20-25%, hypertension, hypothyroidism, and severe Alzheimer's dementia who presents from the nursing home apparently because of chest pain and wheezing. The patient's dementia is severe and she is unable to provide a history herself but denies any chest pain or wheezing. Her daughter has not noticed anything unusual and is unclear as to the details for the transfer to the hospital. She was admitted to the internal medicine teaching service to sort these issues out.  This morning on rounds Ms. Thurmon was without complaints and appeared quite comfortable. Of note, her weight upon admission was 125 pounds which is 7 pounds higher than the discharge weight in July which was felt to be close to her dry weight.  Physical Exam - key components related to admission:  Filed Vitals:   01/24/14 0516 01/24/14 0900 01/24/14 0945 01/24/14 1300  BP: 140/68 150/79 154/76 128/60  Pulse: 66 70 75 74  Temp: 97.7 F (36.5 C) 97.3 F (36.3 C)  97.8 F (36.6 C)  TempSrc: Oral Oral  Axillary  Resp: 18 18  18   Height:      Weight: 122 lb 4.8 oz (55.475 kg)     SpO2: 97% 99% 100% 98%   General: Well-developed, well-nourished, woman lying comfortably in bed at about 15 in no acute distress. Neck: Jugular venous pressure approximately 6-7 cm of water. Lungs: Clear to auscultation bilaterally in the anterior lung fields. Heart: Regular rate and rhythm with an S3 gallop. Extremities: Trace pitting edema  to the mid shins bilaterally.  Lab results:  Basic Metabolic Panel:  Recent Labs  01/23/14 1642 01/24/14 0430  NA 142 146  K 3.4* 3.3*  CL 103 105  CO2 26 29  GLUCOSE 77 92  BUN 35* 31*  CREATININE 1.24* 1.28*  CALCIUM 9.1 9.1   CBC:  Recent Labs  01/23/14 1642  WBC 4.0  NEUTROABS 2.4  HGB 10.7*  HCT 32.2*  MCV 85.2  PLT 189   Cardiac Enzymes:  Recent Labs  01/23/14 2147 01/24/14 0430  TROPONINI <0.30 <0.30   Thyroid Function Tests:  Recent Labs  01/24/14 0430  TSH 45.100*   Misc. Labs:  ProBNP 95,638  Imaging results:  Dg Chest 2 View  01/23/2014   CLINICAL DATA:  Cough, shortness of Breath  EXAM: CHEST  2 VIEW  COMPARISON:  7/ 16/15  FINDINGS: Cardiomegaly. Central mild vascular congestion without convincing pulmonary edema. There is small left pleural effusion with left basilar atelectasis or infiltrate.  IMPRESSION: Cardiomegaly. No pulmonary edema. Small left pleural effusion with left basilar atelectasis or infiltrate.   Electronically Signed   By: Lahoma Crocker M.D.   On: 01/23/2014 17:55   Other results:  EKG: Normal sinus rhythm at 77 beats per minute, normal axis, left ventricular hypertrophy by voltage in aVL, left atrial enlargement, poor R wave progression, no ST segment changes, lateral T wave flattening.  Assessment & Plan by Problem:  Ms. Lahman is an 80 year old woman with chronic left ventricular systolic heart failure, hypertension, hypothyroidism, and Alzheimer's  disease who was transferred from her nursing home to the emergency department for unclear reasons. She was found to be 7 pounds heavier than her presumed dry weight and noted to have Kerley B line's on her chest x-ray and an elevated proBNP of 23,000 up from 17,000 during the previous admission. She is therefore being admitted for acute decompensation on her chronic left ventricular systolic dysfunction. The cause of this decompensation is unclear but may be related to inadequately  treated hypothyroidism, and afterload that is higher than appropriate for her left ventricular systolic dysfunction, or some other unclear reason. Fortunately she is comfortable and has responded well to IV diuresis since presentation.  1) Decompensated left ventricular systolic dysfunction: We will continue with the IV diuresis with the goal of achieving dry weight of 118 pounds. We have increased the losartan to 50 mg daily in hopes of further reducing her afterload. At discharge, she will be placed back on her Lasix dose with the only change being an increase in her losartan in hopes of keeping her heart failure better compensated.  2) Hypothyroidism: We will increase the Synthroid dose slightly in hopes of achieving a euthyroid state.  3) Disposition: I anticipate she will be near her dry weight in 24-48 hours and should be ready for transfer back to her nursing home at that time on her slightly escalated heart failure regimen.

## 2014-01-24 NOTE — Progress Notes (Signed)
Subjective: She complains of no chest pain or shortness of breath this morning. Her weight is down by 3 lbs at 122lbs. Her dry weight appears to be at 118 lbs per recent hospital discharge.   Objective: Vital signs in last 24 hours: Filed Vitals:   01/24/14 0151 01/24/14 0516 01/24/14 0900 01/24/14 0945  BP: 123/68 140/68 150/79 154/76  Pulse: 61 66 70 75  Temp: 97.8 F (36.6 C) 97.7 F (36.5 C) 97.3 F (36.3 C)   TempSrc: Oral Oral Oral   Resp: 18 18 18    Height:      Weight:  122 lb 4.8 oz (55.475 kg)    SpO2: 100% 97% 99% 100%   Weight change:   Intake/Output Summary (Last 24 hours) at 01/24/14 1114 Last data filed at 01/24/14 0948  Gross per 24 hour  Intake      3 ml  Output      0 ml  Net      3 ml   Vitals reviewed. General: resting in bed, in NAD. Pleasantly demented HEENT: no scleral icterus Cardiac: RRR, no rubs, 2/6 SEM, + S3 gallop Pulm: clear to auscultation bilaterally but shallow breaths, no wheezes, rales, or rhonchi Abd: soft, nontender, nondistended, BS present Ext: warm and well perfused, 1+ pretibial edema bilaterally up to her knees Neuro: alert,  able to move all extremities voluntarily  Lab Results: Basic Metabolic Panel:  Recent Labs Lab 01/23/14 1642 01/24/14 0430  NA 142 146  K 3.4* 3.3*  CL 103 105  CO2 26 29  GLUCOSE 77 92  BUN 35* 31*  CREATININE 1.24* 1.28*  CALCIUM 9.1 9.1   CBC:  Recent Labs Lab 01/23/14 1642  WBC 4.0  NEUTROABS 2.4  HGB 10.7*  HCT 32.2*  MCV 85.2  PLT 189   Cardiac Enzymes:  Recent Labs Lab 01/23/14 2147 01/24/14 0430  TROPONINI <0.30 <0.30   BNP:  Recent Labs Lab 01/23/14 1642  PROBNP 23639.0*   Thyroid Function Tests:  Recent Labs Lab 01/24/14 0430  TSH 45.100*    Micro Results: Recent Results (from the past 240 hour(s))  MRSA PCR SCREENING     Status: None   Collection Time    01/23/14  8:48 PM      Result Value Ref Range Status   MRSA by PCR NEGATIVE  NEGATIVE Final     Comment:            The GeneXpert MRSA Assay (FDA     approved for NASAL specimens     only), is one component of a     comprehensive MRSA colonization     surveillance program. It is not     intended to diagnose MRSA     infection nor to guide or     monitor treatment for     MRSA infections.   Studies/Results: Dg Chest 2 View  01/23/2014   CLINICAL DATA:  Cough, shortness of Breath  EXAM: CHEST  2 VIEW  COMPARISON:  7/ 16/15  FINDINGS: Cardiomegaly. Central mild vascular congestion without convincing pulmonary edema. There is small left pleural effusion with left basilar atelectasis or infiltrate.  IMPRESSION: Cardiomegaly. No pulmonary edema. Small left pleural effusion with left basilar atelectasis or infiltrate.   Electronically Signed   By: Lahoma Crocker M.D.   On: 01/23/2014 17:55   Medications: I have reviewed the patient's current medications. Scheduled Meds: . heparin  5,000 Units Subcutaneous 3 times per day  . levothyroxine  88  mcg Oral QAC breakfast  . losartan  25 mg Oral Daily  . metoprolol tartrate  12.5 mg Oral BID  . risperiDONE  0.25 mg Oral BID  . sodium chloride  3 mL Intravenous Q12H  . spironolactone  12.5 mg Oral Daily  . traZODone  150 mg Oral QHS   Continuous Infusions:  PRN Meds:.acetaminophen (TYLENOL) oral liquid 160 mg/5 mL, ondansetron (ZOFRAN) IV, ondansetron Assessment/Plan: Ms. Stitt is an 80 year old woman with PMH of CHF (EF 20-25%), Alzheimer's disease, hypertension, hypothyroidism, who presents from her nursing home with reports of chest pain and wheezing with weight up by 6lbs, concerning for acute exacerbation of congestive heart failure.   # Acute exacerbation of congestive heart failure: Dry is 118 lbs, was 125lbs on presentation with proBNP of 23K, up from 17K one month ago. Her weight is down to 122lbs this morning after receiving IV lasix. UOP has not been recorded. She denies SOB by still appears volume overloaded with pitting edema. Trop  x3 negative, EKG with no acute changes.  -Give Lasix 40mg  IV  -strict ins and outs - daily weights  - continue losartan, metop, and spironolactone -BMET in am  # Hypothyroidism: Patient has reportedly been compliant with Synthroid for the past 4 weeks. TSH here still elevated at 45.  - increase home synthroid from 73mcg to 133mcg with recheck of TSH in 4-6weeks.   # Dementia  - Continue home trazadone and risperidone   Dispo: Disposition is deferred at this time, awaiting improvement of current medical problems. Anticipated discharge in approximately 1 day.   The patient does have a current PCP Janifer Adie, MD) and does need an [facility] hospital follow-up appointment after discharge.   The patient does not have transportation limitations that hinder transportation to clinic appointments.    LOS: 1 day   Blain Pais, MD 01/24/2014, 11:14 AM

## 2014-01-25 DIAGNOSIS — E876 Hypokalemia: Secondary | ICD-10-CM

## 2014-01-25 LAB — BASIC METABOLIC PANEL
ANION GAP: 12 (ref 5–15)
BUN: 28 mg/dL — ABNORMAL HIGH (ref 6–23)
CALCIUM: 9 mg/dL (ref 8.4–10.5)
CO2: 29 mEq/L (ref 19–32)
Chloride: 103 mEq/L (ref 96–112)
Creatinine, Ser: 1.21 mg/dL — ABNORMAL HIGH (ref 0.50–1.10)
GFR calc Af Amer: 47 mL/min — ABNORMAL LOW (ref >90)
GFR calc non Af Amer: 41 mL/min — ABNORMAL LOW (ref >90)
Glucose, Bld: 92 mg/dL (ref 70–99)
POTASSIUM: 3.2 meq/L — AB (ref 3.7–5.3)
SODIUM: 144 meq/L (ref 137–147)

## 2014-01-25 MED ORDER — POTASSIUM CHLORIDE ER 10 MEQ PO TBCR: EXTENDED_RELEASE_TABLET | ORAL | Status: DC

## 2014-01-25 MED ORDER — POTASSIUM CHLORIDE ER 10 MEQ PO TBCR: EXTENDED_RELEASE_TABLET | ORAL | Status: AC

## 2014-01-25 MED ORDER — LEVOTHYROXINE SODIUM 100 MCG PO TABS: 100.0000 ug | ORAL_TABLET | Freq: Every day | ORAL | Status: AC

## 2014-01-25 MED ORDER — RISPERIDONE 1 MG/ML PO SOLN
0.5000 mg | Freq: Once | ORAL | Status: AC
  Administered 2014-01-25: 0.5 mg via ORAL
  Filled 2014-01-25: qty 0.5

## 2014-01-25 MED ORDER — LOSARTAN POTASSIUM 50 MG PO TABS: 50.0000 mg | ORAL_TABLET | Freq: Every day | ORAL | Status: DC

## 2014-01-25 MED ORDER — LOSARTAN POTASSIUM 50 MG PO TABS: 50.0000 mg | ORAL_TABLET | Freq: Every day | ORAL | Status: AC

## 2014-01-25 MED ORDER — LEVOTHYROXINE SODIUM 100 MCG PO TABS: 100.0000 ug | ORAL_TABLET | Freq: Every day | ORAL | Status: DC

## 2014-01-25 NOTE — Plan of Care (Signed)
Problem: Phase I Progression Outcomes Goal: EF % per last Echo/documented,Core Reminder form on chart Outcome: Completed/Met Date Met:  01/25/14 EF performed on 12/18/2013 EF result : 20-25%

## 2014-01-25 NOTE — Progress Notes (Signed)
Notified on-call internal medicine doctor of patient frequently trying to get out of bed and actually standing up. Each time bed alarm has been on to alert staff. Patient has history of Dementia but doesn't understand safety of calling for assistance. Orders given for one time dose of Risperdal. Will carry out doctors order and continue to monitor patient to end of shift.

## 2014-01-25 NOTE — Care Management Note (Addendum)
  Page 1 of 1   01/25/2014     4:26:52 PM CARE MANAGEMENT NOTE 01/25/2014  Patient:  Jenny Reyes, Jenny Reyes   Account Number:  [id number]  Date Initiated:  01/25/2014  Documentation initiated by:  Mariann Laster  Subjective/Objective Assessment:   epigastric pain, CHF     Action/Plan:   CM to follow for disposition needs   Anticipated DC Date:  01/25/2014   Anticipated DC Plan:  [place]  In-house referral  Clinical Social Worker         Choice offered to / List presented to:             Status of service:  Completed, signed off Medicare Important Message given?  NA - LOS <3 / Initial given by admissions (If response is "NO", the following Medicare IM given date fields will be blank) Date Medicare IM given:   Medicare IM given by:   Date Additional Medicare IM given:   Additional Medicare IM given by:    Discharge Disposition:  [place]  Per UR Regulation:  Reviewed for med. necessity/level of care/duration of stay  If discussed at [place] of Stay Meetings, dates discussed:    Comments:  Lariza Cothron RN, BSN, MSHL, CCM  Nurse - Case Manager,  ([facility] [place] [phone]  01/25/2014 Disposition Plan:  SNF - SW active.

## 2014-01-25 NOTE — Progress Notes (Signed)
Discharged to [facility] [facility] facility. [facility] transporting patient to facility.

## 2014-01-25 NOTE — Progress Notes (Signed)
  Date: 01/25/2014  Patient name: Camellia Popescu  Medical record number: [id number]  Date of birth: 1928-11-06   This patient has been seen and the plan of care was discussed with the house staff. Please see their note for complete details. I concur with their findings with the following additions/corrections: Ms Vandrunen's weight is now at baseline. She is hypokalemic and will need repeleted as an outpt and close F/U with [facility]. D/C home.   Bartholomew Crews, MD 01/25/2014, 4:15 PM

## 2014-01-25 NOTE — Progress Notes (Signed)
Paged Dr. Charlott Rakes to notifiy of potassium of 3.2. New orders will be added for potassium.

## 2014-01-25 NOTE — Discharge Summary (Signed)
Name: Lyn Joens MRN: [id number] DOB: June 22, 1928 80 y.o. PCP: Janifer Adie, MD  Date of Admission: 01/23/2014  3:00 PM Date of Discharge: 01/25/2014 Attending Physician: Karren Cobble, MD  Discharge Diagnosis: Principal Problem:   CHF exacerbation Active Problems:   Dementia with behavioral disturbance   Unspecified hypothyroidism   Essential hypertension, benign   CHF (congestive heart failure)   Acute exacerbation of CHF (congestive heart failure)   Hypokalemia  Discharge Medications:   Medication List         ACETAMINOPHEN EXTRA STRENGTH 167 MG/5ML Liqd  Generic drug:  Acetaminophen  Take 15 mLs by mouth every 6 (six) hours as needed (for pain).     furosemide 40 MG tablet  Commonly known as:  LASIX  Take 1 tablet (40 mg total) by mouth 2 (two) times daily.     levothyroxine 100 MCG tablet  Commonly known as:  SYNTHROID, LEVOTHROID  Take 1 tablet (100 mcg total) by mouth daily before breakfast.     losartan 50 MG tablet  Commonly known as:  COZAAR  Take 1 tablet (50 mg total) by mouth daily.     metoprolol tartrate 12.5 mg Tabs tablet  Commonly known as:  LOPRESSOR  Take 0.5 tablets (12.5 mg total) by mouth 2 (two) times daily.     potassium chloride 10 MEQ tablet  Commonly known as:  K-DUR  Take five tablets NOW and then as needed thereafter based off of the doctor's recommendations.     risperiDONE 1 MG/ML oral solution  Commonly known as:  RISPERDAL  Take 0.25 mg by mouth 2 (two) times daily.     spironolactone 12.5 mg Tabs tablet  Commonly known as:  ALDACTONE  Take 12.5 mg by mouth daily.     traZODone 150 MG tablet  Commonly known as:  DESYREL  Take 150 mg by mouth at bedtime.        Disposition and follow-up:   Ms.Etherine Dawn was discharged from [facility] in Stable condition.  At the hospital follow up visit please address:  1.  CHF: volume status  2. Hypokalemia  3.  Labs / imaging needed at time of  follow-up: TSH (4-6 weeks), BMET (K)  4.  Pending labs/ test needing follow-up: none  Follow-up Appointments: Follow-up Information   Follow up with Sherian Maroon, MD. Schedule an appointment as soon as possible for a visit in 1 week.   Specialty:  Family Medicine   Contact information:   [address] [address] [phone]       Discharge Instructions: Discharge Instructions   Call MD for:  difficulty breathing, headache or visual disturbances    Complete by:  As directed      Call MD for:  extreme fatigue    Complete by:  As directed      Call MD for:  persistant dizziness or light-headedness    Complete by:  As directed      Diet - low sodium heart healthy    Complete by:  As directed      Increase activity slowly    Complete by:  As directed            Consultations:    Procedures Performed:  Dg Chest 2 View  01/23/2014   CLINICAL DATA:  Cough, shortness of Breath  EXAM: CHEST  2 VIEW  COMPARISON:  7/ 16/15  FINDINGS: Cardiomegaly. Central mild vascular congestion without convincing pulmonary edema. There is  small left pleural effusion with left basilar atelectasis or infiltrate.  IMPRESSION: Cardiomegaly. No pulmonary edema. Small left pleural effusion with left basilar atelectasis or infiltrate.   Electronically Signed   By: Lahoma Crocker M.D.   On: 01/23/2014 17:55    Admission HPI: Ms. Mikkelsen is a 80 year old female with a history of congestive heart failure (EF 20-25%), Alzheimer's disease, hypertension, hypothyroidism, who presents from her nursing home with reports of chest pain and wheezing. Patient is unable to provide history herself, and history is provided by her daughter. Her daughter reports that she was sent from the nursing home, but is unclear about the details. She denies any signs that the patient has had chest pain or wheezing since she has seen her in the hospital. The patient also denies any current complaints, though questioning  is severely limited by the patient's dementia.   Of note, patient had a recent admission from 12/17/2013 to 12/21/2013 for an acute on chronic CHF exacerbation that was remarkable for increasing bilateral lower extremity edema. At that point, patient had a reported 25 pound weight gain 1-2 months prior to presentation and was down 22 pounds by the time of discharge. She was also found to have an elevated TSH in the context of having stopped her Synthroid.   Since her discharge, patient is been taking her Synthroid as administered by her nursing home and has gained 6 pounds.   Hospital Course by problem list:   #CHF exacerbation: She was diuresed with Lasix 40mg  IV x 3 and put out a total of -3.2L. Given her systolic dysfunction (EF 16-01%), her losartan was increased 25->50mg  to better lower her afterload. She was continued on spironolactone 12.5mg  and metoprolol 12.5mg  BID. Her home dose of Lasix was held and will be resumed following discharge.   #Hypokalemia: K was 3.4 on admission and 3.2 at the time of discharge. She will be sent home with K supplement and should take as needed with her Lasix.   #Hypothyroidism: TSH was 45.1, so her Synthroid was subsequently increased 88->171mcg. She will need a follow-up TSH in 4-6 weeks for further dose titration.  Discharge Vitals:   BP 130/73  Pulse 72  Temp(Src) 97 F (36.1 C) (Axillary)  Resp 18  Ht 5\' 2"  (1.575 m)  Wt 116 lb 13.5 oz (53 kg)  BMI 21.37 kg/m2  SpO2 95%  Discharge Labs:  Results for orders placed during the hospital encounter of 01/23/14 (from the past 24 hour(s))  BASIC METABOLIC PANEL     Status: Abnormal   Collection Time    01/25/14  4:12 AM      Result Value Ref Range   Sodium 144  137 - 147 mEq/L   Potassium 3.2 (*) 3.7 - 5.3 mEq/L   Chloride 103  96 - 112 mEq/L   CO2 29  19 - 32 mEq/L   Glucose, Bld 92  70 - 99 mg/dL   BUN 28 (*) 6 - 23 mg/dL   Creatinine, Ser 1.21 (*) 0.50 - 1.10 mg/dL   Calcium 9.0  8.4 -  10.5 mg/dL   GFR calc non Af Amer 41 (*) >90 mL/min   GFR calc Af Amer 47 (*) >90 mL/min   Anion gap 12  5 - 15    Signed: Charlott Rakes, MD 01/25/2014, 12:49 PM    Services Ordered on Discharge: SNF Equipment Ordered on Discharge: None

## 2014-01-25 NOTE — Progress Notes (Signed)
Report called to [facility]. Patient will start taking daily potassium once she arrives back to the facility.

## 2014-01-25 NOTE — Discharge Instructions (Signed)
Thank you for trusting [place] with your medical care!  You were hospitalized for acute heart failure exacerbation. You were given IV doses of Lasix to help reduce the fluid overload.   Your potassium was low, so please take 5 tablets today and recheck potassium thereafter.   We increased your losartan (Cozaar) dose from 25mg  to 50mg  to better help prevent this from happening again.   For your thyroid, we increased your dose of Synthroid from 55mcg to 180mcg.   Otherwise, please resume taking the remainder of your medications.

## 2014-01-25 NOTE — Progress Notes (Signed)
Subjective: No acute events overnight. She was eating breakfast this AM when I saw her and had no complaints.  Objective: Vital signs in last 24 hours: Filed Vitals:   01/24/14 1300 01/24/14 2140 01/25/14 0614 01/25/14 0645  BP: 128/60 144/61 131/77   Pulse: 74 87 75   Temp: 97.8 F (36.6 C) 98.5 F (36.9 C) 97 F (36.1 C)   TempSrc: Axillary Oral Axillary   Resp: 18 18 18    Height:      Weight:    116 lb 13.5 oz (53 kg)  SpO2: 98% 96% 95%    Weight change: -8 lb 2.5 oz (-3.7 kg)  Intake/Output Summary (Last 24 hours) at 01/25/14 0746 Last data filed at 01/25/14 0647  Gross per 24 hour  Intake    243 ml  Output   3650 ml  Net  -3407 ml   General: resting in bed, in NAD. Pleasantly demented  HEENT: no scleral icterus  Cardiac: RRR, no rubs, 2/6 systolic ejection murmur at LUSB, + S3 gallop  Pulm: clear to auscultation bilaterally in the anterior fields, no wheezes, rales, or rhonchi  Abd: soft, nontender, nondistended, BS present  Ext: warm and well perfused, trace LE edema bilaterally Neuro: alert, able to move all extremities voluntarily   Lab Results: Basic Metabolic Panel:  Recent Labs Lab 01/24/14 0430 01/25/14 0412  NA 146 144  K 3.3* 3.2*  CL 105 103  CO2 29 29  GLUCOSE 92 92  BUN 31* 28*  CREATININE 1.28* 1.21*  CALCIUM 9.1 9.0   Micro Results: Recent Results (from the past 240 hour(s))  MRSA PCR SCREENING     Status: None   Collection Time    01/23/14  8:48 PM      Result Value Ref Range Status   MRSA by PCR NEGATIVE  NEGATIVE Final   Comment:            The GeneXpert MRSA Assay (FDA     approved for NASAL specimens     only), is one component of a     comprehensive MRSA colonization     surveillance program. It is not     intended to diagnose MRSA     infection nor to guide or     monitor treatment for     MRSA infections.   Studies/Results: Dg Chest 2 View  01/23/2014   CLINICAL DATA:  Cough, shortness of Breath  EXAM: CHEST  2  VIEW  COMPARISON:  7/ 16/15  FINDINGS: Cardiomegaly. Central mild vascular congestion without convincing pulmonary edema. There is small left pleural effusion with left basilar atelectasis or infiltrate.  IMPRESSION: Cardiomegaly. No pulmonary edema. Small left pleural effusion with left basilar atelectasis or infiltrate.   Electronically Signed   By: Lahoma Crocker M.D.   On: 01/23/2014 17:55   Medications: I have reviewed the patient's current medications. Scheduled Meds: . heparin  5,000 Units Subcutaneous 3 times per day  . levothyroxine  100 mcg Oral QAC breakfast  . losartan  50 mg Oral Daily  . metoprolol tartrate  12.5 mg Oral BID  . risperiDONE  0.25 mg Oral BID  . sodium chloride  3 mL Intravenous Q12H  . spironolactone  12.5 mg Oral Daily  . traZODone  150 mg Oral QHS   Continuous Infusions:  PRN Meds:.acetaminophen (TYLENOL) oral liquid 160 mg/5 mL, ondansetron (ZOFRAN) IV, ondansetron Assessment/Plan: Principal Problem:   CHF exacerbation Active Problems:   Dementia with behavioral disturbance  Unspecified hypothyroidism   Essential hypertension, benign   CHF (congestive heart failure)   Acute exacerbation of CHF (congestive heart failure)  Ms. Maxham is an 80 year old woman with PMH of CHF (EF 20-25%), Alzheimer's disease, HTN, hypothyroidism, who was hospitalized for acute exacerbation of congestive heart failure.   # Acute exacerbation of congestive heart failure: Today here weight is 113 lbs, down from her dry weight of 118 lbs. She is stable for discharge today.  #FEN:  -Diet: regular  #DVT prophylaxis: heparin 5000 units subcutaneous  #CODE STATUS: FULL CODE   Dispo: Disposition is deferred at this time, awaiting improvement of current medical problems.  Anticipated discharge in approximately 1-2 day(s).   The patient does have a current PCP Janifer Adie, MD) and does not need an [facility] hospital follow-up appointment after discharge.  The patient does have  transportation limitations that hinder transportation to clinic appointments.  .Services Needed at time of discharge: Y = Yes, Blank = No PT:   OT:   RN:   Equipment:   Other:     LOS: 2 days   Charlott Rakes, MD 01/25/2014, 7:46 AM

## 2014-01-26 NOTE — Progress Notes (Signed)
  Date: 01/26/2014  Patient name: Jenny Reyes  Medical record number: [id number]  Date of birth: 1928/11/03   This patient has been seen and the plan of care was discussed with the house staff. Please see their note for complete details. I concur with their findings. Bartholomew Crews, MD 01/26/2014, 5:26 PM

## 2014-03-04 DEATH — deceased

## 2015-09-19 IMAGING — CR DG CHEST 2V
2 series · 2 of 2 positions shown · non-contrast
Comparison: 12/15/2013

CLINICAL DATA: Chest pain

EXAM:
CHEST  2 VIEW

[w chest lat]
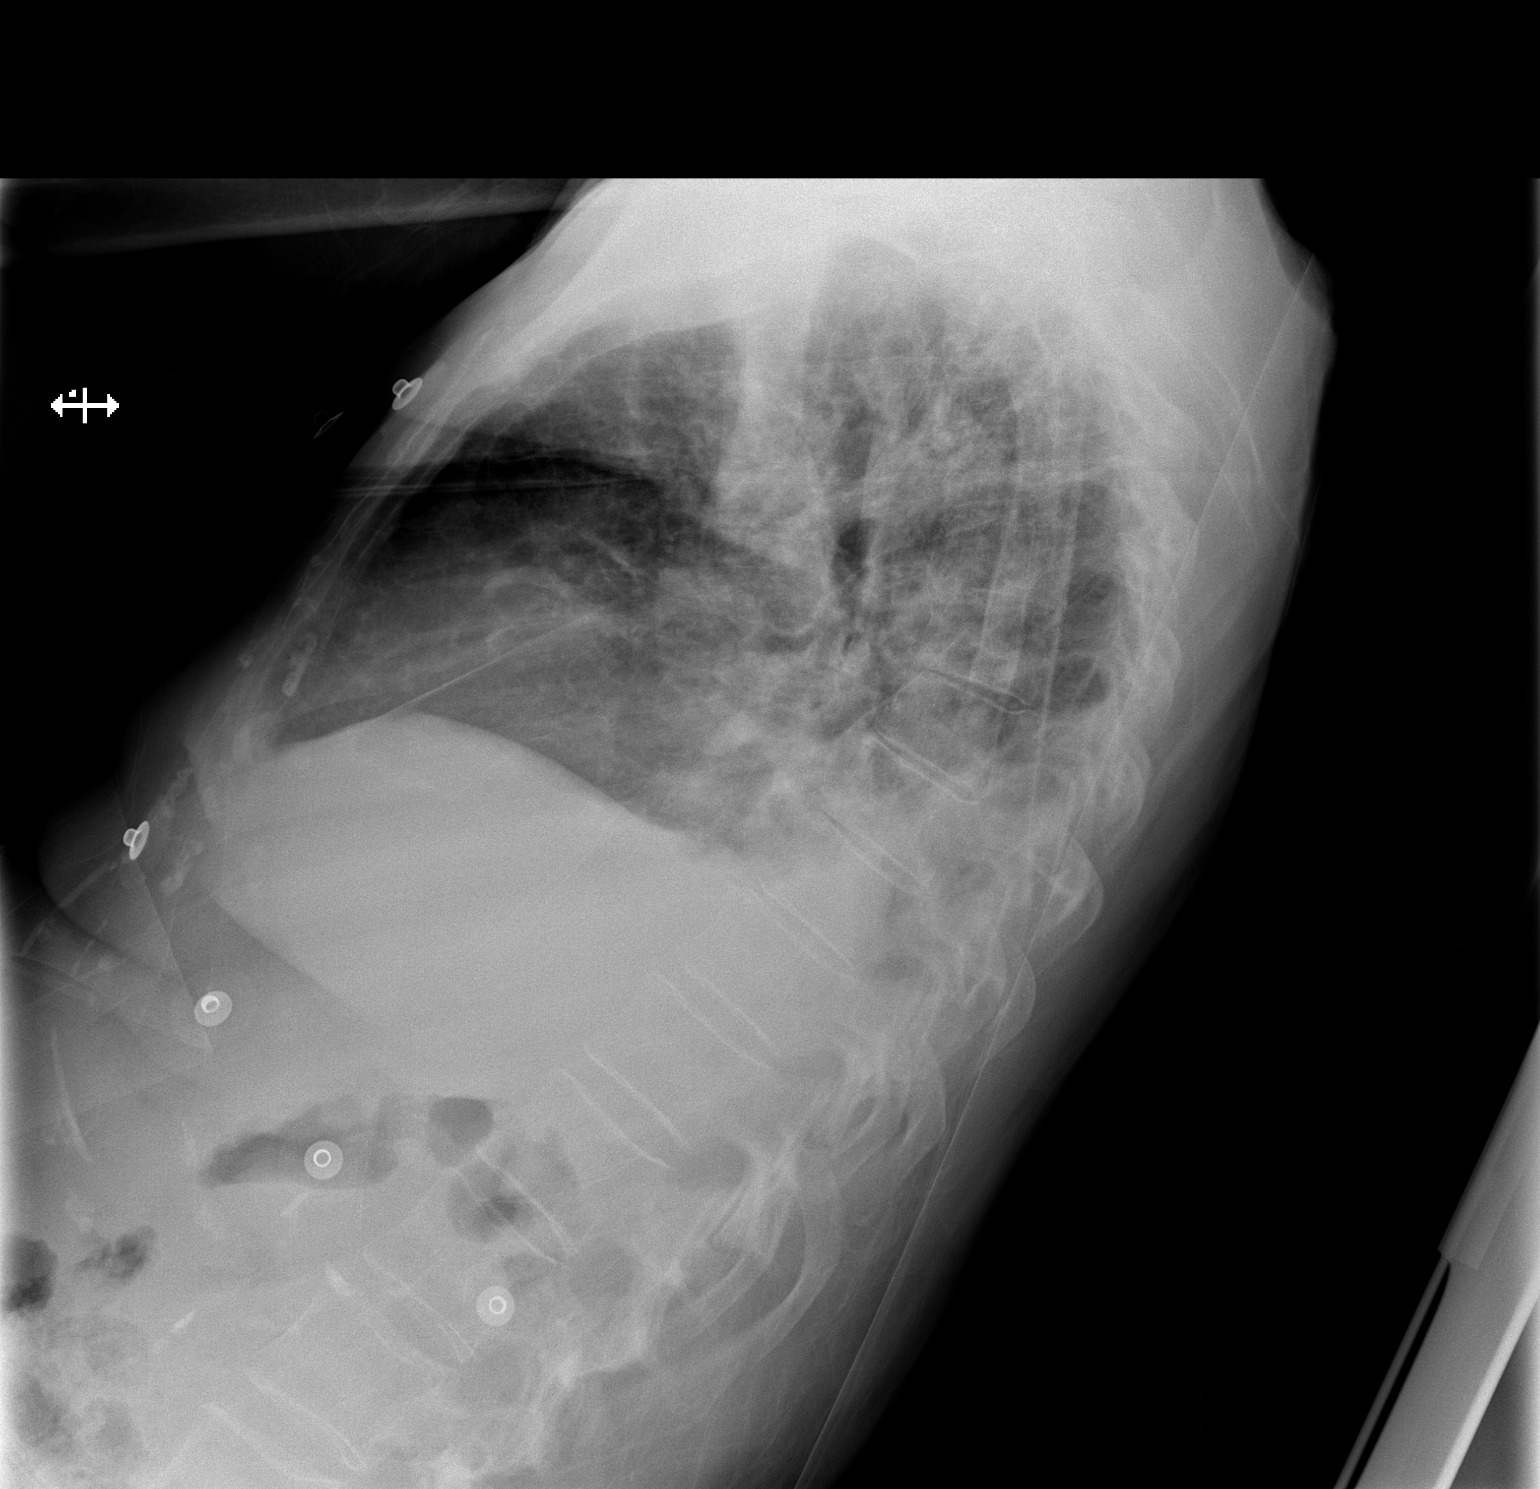

[x chest ap]
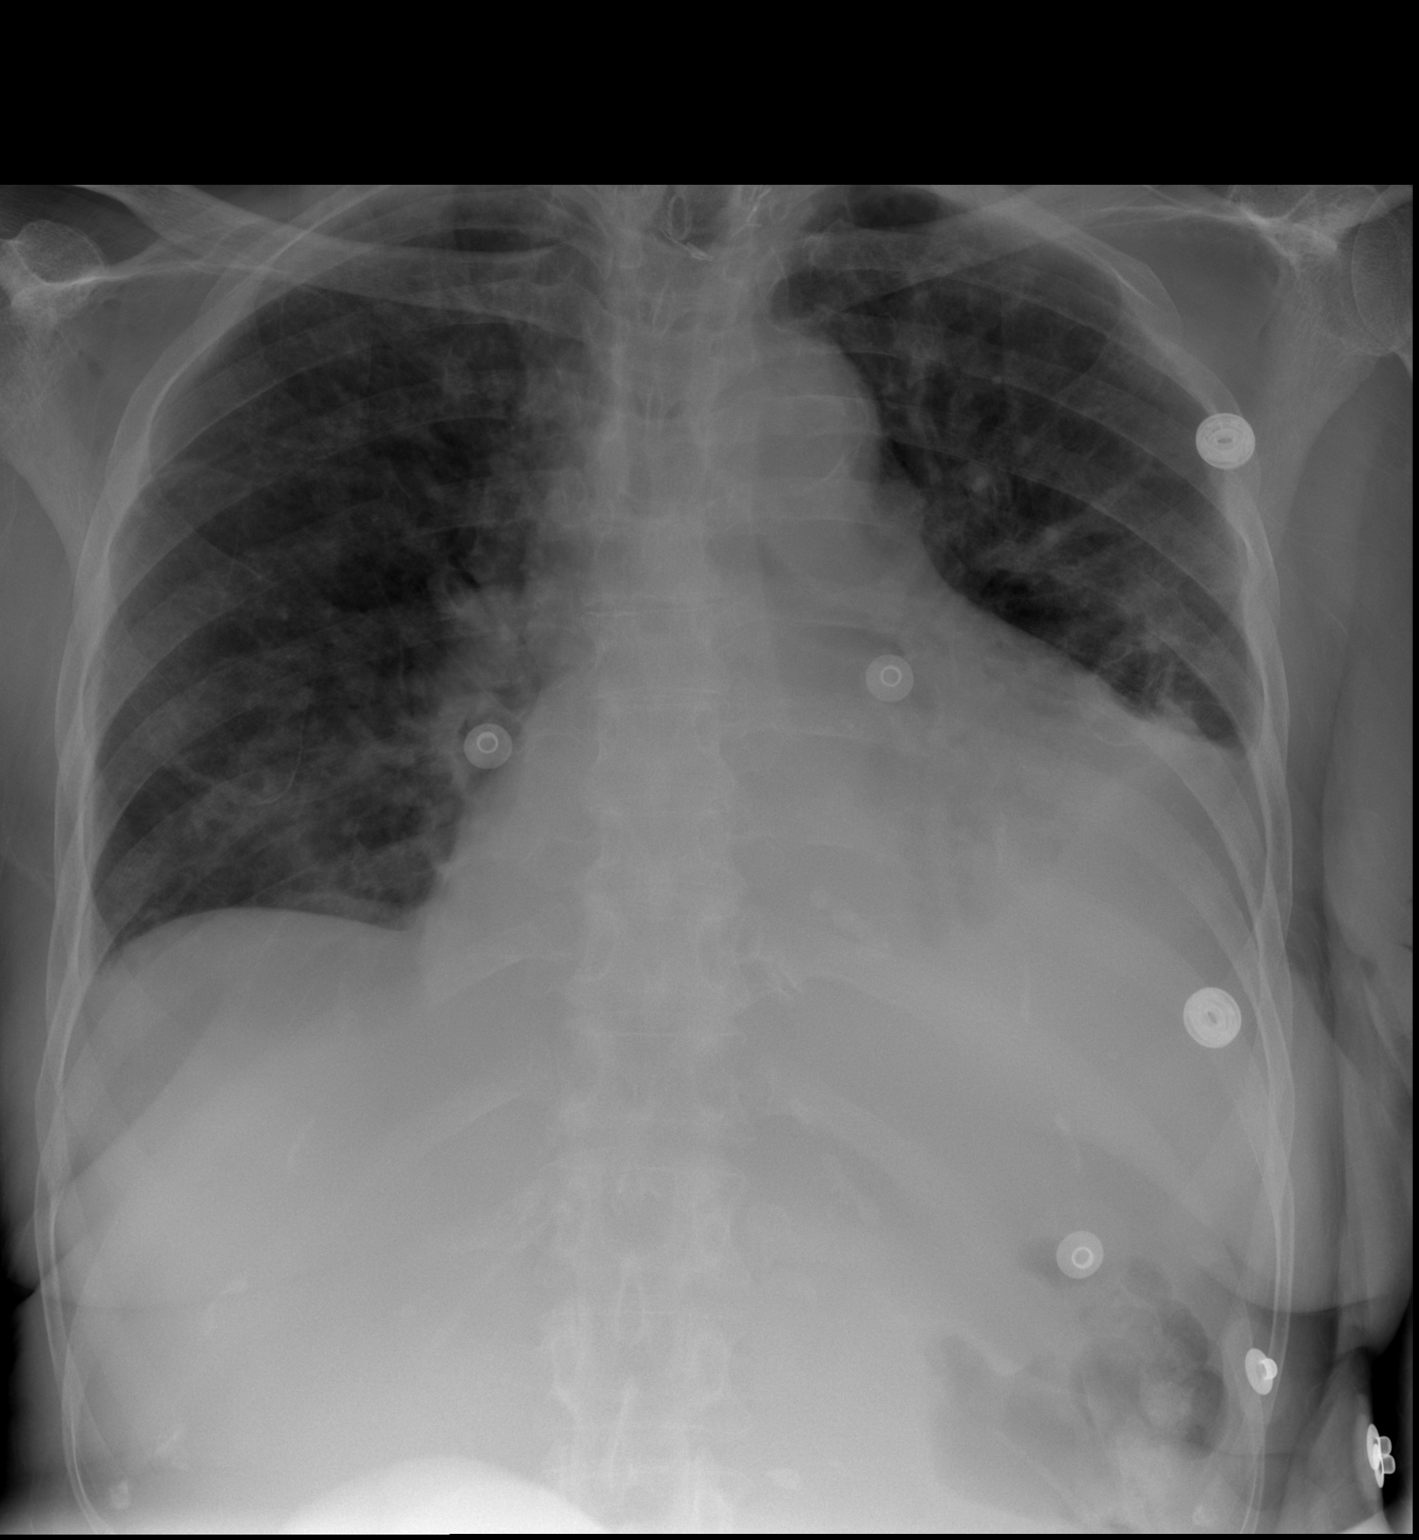

[2 of 2 positions shown; findings below may reference images not displayed]

FINDINGS: Grossly unchanged marked enlargement of the cardiac silhouette and
mediastinal contours with atherosclerotic plaque within the thoracic
aorta. The pulmonary vasculature appears less distinct than present
examination with cephalization of flow. Grossly unchanged small
bilateral pleural effusions with associated bibasilar opacities,
left greater than right. No new focal airspace opacities. No
pneumothorax. Unchanged bones.
IMPRESSION: Grossly unchanged findings of cardiomegaly, pulmonary edema, small
bilateral effusions and associated bibasilar opacities, left greater
than right, atelectasis versus infiltrate.
